# Patient Record
Sex: Female | Born: 1965 | Race: White | Hispanic: No | Marital: Married | Smoking: Current every day smoker
Health system: Southern US, Community
[De-identification: ages and names within clinical notes are randomized; demographics above are authoritative.]

## PROBLEM LIST (undated history)

## (undated) DIAGNOSIS — J449 Chronic obstructive pulmonary disease, unspecified: Secondary | ICD-10-CM

## (undated) DIAGNOSIS — G8929 Other chronic pain: Secondary | ICD-10-CM

## (undated) DIAGNOSIS — M419 Scoliosis, unspecified: Secondary | ICD-10-CM

## (undated) DIAGNOSIS — I219 Acute myocardial infarction, unspecified: Secondary | ICD-10-CM

## (undated) DIAGNOSIS — Z9989 Dependence on other enabling machines and devices: Secondary | ICD-10-CM

## (undated) DIAGNOSIS — I10 Essential (primary) hypertension: Secondary | ICD-10-CM

## (undated) DIAGNOSIS — R0600 Dyspnea, unspecified: Secondary | ICD-10-CM

## (undated) DIAGNOSIS — M549 Dorsalgia, unspecified: Secondary | ICD-10-CM

## (undated) DIAGNOSIS — M1990 Unspecified osteoarthritis, unspecified site: Secondary | ICD-10-CM

## (undated) DIAGNOSIS — G4730 Sleep apnea, unspecified: Secondary | ICD-10-CM

## (undated) DIAGNOSIS — K219 Gastro-esophageal reflux disease without esophagitis: Secondary | ICD-10-CM

## (undated) HISTORY — PX: APPENDECTOMY: SHX54

## (undated) HISTORY — PX: TONSILLECTOMY: SUR1361

---

## 2013-01-01 ENCOUNTER — Encounter (HOSPITAL_COMMUNITY): Payer: Self-pay | Admitting: Emergency Medicine

## 2013-01-01 ENCOUNTER — Emergency Department (HOSPITAL_COMMUNITY)
Admission: EM | Admit: 2013-01-01 | Discharge: 2013-01-02 | Disposition: A | Discharge status: Home / Self Care | Payer: Self-pay | Attending: Emergency Medicine | Admitting: Emergency Medicine

## 2013-01-01 DIAGNOSIS — Z09 Encounter for follow-up examination after completed treatment for conditions other than malignant neoplasm: Secondary | ICD-10-CM | POA: Insufficient documentation

## 2013-01-01 DIAGNOSIS — M545 Low back pain, unspecified: Secondary | ICD-10-CM | POA: Insufficient documentation

## 2013-01-01 DIAGNOSIS — Z9989 Dependence on other enabling machines and devices: Secondary | ICD-10-CM | POA: Insufficient documentation

## 2013-01-01 DIAGNOSIS — G8929 Other chronic pain: Secondary | ICD-10-CM | POA: Insufficient documentation

## 2013-01-01 DIAGNOSIS — G4730 Sleep apnea, unspecified: Secondary | ICD-10-CM | POA: Insufficient documentation

## 2013-01-01 DIAGNOSIS — F17200 Nicotine dependence, unspecified, uncomplicated: Secondary | ICD-10-CM | POA: Insufficient documentation

## 2013-01-01 DIAGNOSIS — M419 Scoliosis, unspecified: Secondary | ICD-10-CM | POA: Insufficient documentation

## 2013-01-01 DIAGNOSIS — Z8739 Personal history of other diseases of the musculoskeletal system and connective tissue: Secondary | ICD-10-CM | POA: Insufficient documentation

## 2013-01-01 HISTORY — DX: Scoliosis, unspecified: M41.9

## 2013-01-01 HISTORY — DX: Sleep apnea, unspecified: G47.30

## 2013-01-01 HISTORY — DX: Dorsalgia, unspecified: M54.9

## 2013-01-01 HISTORY — DX: Other chronic pain: G89.29

## 2013-01-01 HISTORY — DX: Dependence on other enabling machines and devices: Z99.89

## 2013-01-01 HISTORY — DX: Unspecified osteoarthritis, unspecified site: M19.90

## 2013-01-01 NOTE — ED Notes (Signed)
Patient reports that she has had severe lower back pain for years; has been seeing a new doctor for five months.  PCP referred patient to come to ED to get x-rays of her back.  Patient on pain medication for chronic back pain; history of scoliosis.

## 2013-01-01 NOTE — ED Provider Notes (Signed)
History   This chart was scribed for non-physician practitioner working with Derwood Kaplan, MD by Leone Payor, ED Scribe. This patient was seen in room TR05C/TR05C and the patient's care was started at 2253.   CSN: [id number]  Arrival date & time 01/01/13  2253   First MD Initiated Contact with Patient 01/01/13 2306      Chief Complaint  Patient presents with  . Back Pain     The history is provided by the patient. No language interpreter was used.    Lori Ayers is a 47 y.o. female who presents to the Emergency Department complaining of constant, unchanged, lower back pain starting several years ago. Pt reports she has been seeing a new doctor for 5 months and PCP referred pt to come to ED to get x-rays of her back. Pt denies numbness, weakness, tingling. Pt states she has a hx of scoliosis. The Rx for imaging was written on 12/09/2012.   Pt has h/o chronic back pain, scoliosis, arthritis.  Pt is a current everyday smoker but denies alcohol use.  PCP Dr. Bascom Levels.  Past Medical History  Diagnosis Date  . Chronic back pain   . Arthritis   . Sleep apnea   . CPAP (continuous positive airway pressure) dependence   . Scoliosis     Past Surgical History  Procedure Date  . Appendectomy   . Tonsillectomy     History reviewed. No pertinent family history.  History  Substance Use Topics  . Smoking status: Current Every Day Smoker -- 0.5 packs/day  . Smokeless tobacco: Not on file  . Alcohol Use: No    No OB history provided.   Review of Systems  A complete 10 system review of systems was obtained and all systems are negative except as noted in the HPI and PMH.   Allergies  Review of patient's allergies indicates no known allergies.  Home Medications  No current outpatient prescriptions on file.  BP 133/82  Pulse 65  Temp 98.3 F (36.8 C) (Oral)  Resp 18  SpO2 97%  LMP 12/29/2012  Physical Exam  Nursing note and vitals reviewed. Constitutional: She appears  well-developed and well-nourished. No distress.  HENT:  Head: Normocephalic and atraumatic.  Eyes: Conjunctivae normal and EOM are normal.  Neck: Normal range of motion. Neck supple.  Cardiovascular:       Intact distal pulses, capillary refill < 3 seconds  Musculoskeletal:       All other extremities with normal ROM  Neurological:       No sensory deficit  Skin: She is not diaphoretic.       Skin intact, no tenting    ED Course  Procedures (including critical care time)  DIAGNOSTIC STUDIES: Oxygen Saturation is 97% on room air, adequate by my interpretation.    COORDINATION OF CARE:   11:22 PM Nurse verifying with charge to find out if allowed to do imaging for non-urgent care.   Labs Reviewed - No data to display No results found.   No diagnosis found.    MDM  Pt presents to ER requesting imaging with a prescription from PCP.  As there is no emergent reason for imaging and pt is not in any new pain she has been advised to present either to urgent care. Or pt is to call her PCP and ask what imaging fasciity to get images from.       I personally performed the services described in this documentation, which was scribed in my  presence. The recorded information has been reviewed and is accurate.       Jaci Carrel, [place] 01/01/13 2339

## 2013-01-02 NOTE — ED Provider Notes (Signed)
Medical screening examination/treatment/procedure(s) were performed by non-physician practitioner and as supervising physician I was immediately available for consultation/collaboration.  Victor Langenbach, MD 01/02/13 1551 

## 2013-01-12 ENCOUNTER — Encounter (HOSPITAL_COMMUNITY): Payer: Self-pay | Admitting: Emergency Medicine

## 2013-01-12 ENCOUNTER — Emergency Department (HOSPITAL_COMMUNITY)
Admission: EM | Admit: 2013-01-12 | Discharge: 2013-01-13 | Disposition: A | Discharge status: Home / Self Care | Payer: Self-pay | Attending: Emergency Medicine | Admitting: Emergency Medicine

## 2013-01-12 DIAGNOSIS — Z79899 Other long term (current) drug therapy: Secondary | ICD-10-CM | POA: Insufficient documentation

## 2013-01-12 DIAGNOSIS — M4120 Other idiopathic scoliosis, site unspecified: Secondary | ICD-10-CM | POA: Insufficient documentation

## 2013-01-12 DIAGNOSIS — F17200 Nicotine dependence, unspecified, uncomplicated: Secondary | ICD-10-CM | POA: Insufficient documentation

## 2013-01-12 DIAGNOSIS — R112 Nausea with vomiting, unspecified: Secondary | ICD-10-CM | POA: Insufficient documentation

## 2013-01-12 DIAGNOSIS — G4733 Obstructive sleep apnea (adult) (pediatric): Secondary | ICD-10-CM | POA: Insufficient documentation

## 2013-01-12 DIAGNOSIS — M419 Scoliosis, unspecified: Secondary | ICD-10-CM

## 2013-01-12 DIAGNOSIS — G8929 Other chronic pain: Secondary | ICD-10-CM | POA: Insufficient documentation

## 2013-01-12 DIAGNOSIS — Z760 Encounter for issue of repeat prescription: Secondary | ICD-10-CM | POA: Insufficient documentation

## 2013-01-12 DIAGNOSIS — Z8739 Personal history of other diseases of the musculoskeletal system and connective tissue: Secondary | ICD-10-CM | POA: Insufficient documentation

## 2013-01-12 DIAGNOSIS — M79609 Pain in unspecified limb: Secondary | ICD-10-CM | POA: Insufficient documentation

## 2013-01-12 DIAGNOSIS — M549 Dorsalgia, unspecified: Secondary | ICD-10-CM

## 2013-01-12 MED ORDER — OXYCODONE-ACETAMINOPHEN 5-325 MG PO TABS: 1.0000 | ORAL_TABLET | Freq: Three times a day (TID) | ORAL | Status: DC

## 2013-01-12 NOTE — ED Provider Notes (Signed)
History   This chart was scribed for non-physician practitioner working with Lori Ayers, by Lori Ayers, ED Scribe. This patient was seen in room TR09C/TR09C and the patient's care was started at 11:33 PM.    CSN: [id number]  Arrival date & time 01/12/13  1852   First MD Initiated Contact with Patient 01/12/13 2252      Chief Complaint  Patient presents with  . Withdrawal     The history is provided by the patient and medical records. No language interpreter was used.  Lori Ayers is a 47 y.o. female with h/o chronic back pain who presents to the Emergency Department complaining of withdrawal from 3-4 years of 15mg  oxycodone 4 times daily use for chronic back pain from prior injury and h/o scoliosis.  Pt reports current nausea, leg cramps, with aching, sharp, 10/10 lower back pain.  Pt reports that his PCP had been providing this supply of oxycodone but had to suddenly stop because he was contacted by the [employer].  Pt reports PCP told her to come here to get oxycodone.  Pt has appointment 02/17 for new pain management doctor.  No recent injuries.  Pain is not currently worsened for any reason other than that she does not have pain medication.  No incontinence, no numbness.  No allergies.  No IV drug use.  Pt is a current everyday smoker but denies alcohol use.  PCP is Dr. Audrie Ayers.   Past Medical History  Diagnosis Date  . Chronic back pain   . Arthritis   . Sleep apnea   . CPAP (continuous positive airway pressure) dependence   . Scoliosis     Past Surgical History  Procedure Date  . Appendectomy   . Tonsillectomy     No family history on file.  History  Substance Use Topics  . Smoking status: Current Every Day Smoker -- 0.5 packs/day  . Smokeless tobacco: Not on file  . Alcohol Use: No    No OB history provided.   Review of Systems  Constitutional: Negative for fever and fatigue.  HENT: Negative for neck pain and neck stiffness.   Respiratory: Negative for chest  tightness and shortness of breath.   Cardiovascular: Negative for chest pain.  Gastrointestinal: Negative for nausea, vomiting, abdominal pain and diarrhea.  Genitourinary: Negative for dysuria, urgency, frequency and hematuria.  Musculoskeletal: Positive for back pain (chronic). Negative for joint swelling and gait problem.  Skin: Negative for rash.  Neurological: Negative for weakness, light-headedness, numbness and headaches.  All other systems reviewed and are negative.    Allergies  Review of patient's allergies indicates no known allergies.  Home Medications   Current Outpatient Rx  Name  Route  Sig  Dispense  Refill  . OMEPRAZOLE 20 MG PO CPDR   Oral   Take 20 mg by mouth daily.         . OXYCODONE HCL 15 MG PO TABS   Oral   Take 15 mg by mouth every 6 (six) hours as needed. For pain         . SULINDAC 200 MG PO TABS   Oral   Take 200 mg by mouth 2 (two) times daily.         . OXYCODONE HCL 10 MG PO TABS   Oral   Take 1 tablet (10 mg total) by mouth 3 (three) times daily.   10 tablet   0     BP 127/79  Pulse 84  Temp 98.3 F (  36.8 C) (Oral)  Resp 18  SpO2 99%  LMP 12/29/2012  Physical Exam  Nursing note and vitals reviewed. Constitutional: She is oriented to person, place, and time. She appears well-developed and well-nourished. No distress.  HENT:  Head: Normocephalic and atraumatic.  Mouth/Throat: Oropharynx is clear and moist. No oropharyngeal exudate.  Eyes: Conjunctivae normal are normal. Pupils are equal, round, and reactive to light.  Neck: Normal range of motion. Neck supple.       Full ROM without pain  Cardiovascular: Normal rate, regular rhythm, normal heart sounds and intact distal pulses.  Exam reveals no gallop and no friction rub.   No murmur heard. Pulmonary/Chest: Effort normal and breath sounds normal. No respiratory distress. She has no wheezes. She has no rales. She exhibits no tenderness.  Abdominal: Soft. Bowel sounds are  normal. She exhibits no distension and no mass. There is no tenderness. There is no rebound and no guarding.  Musculoskeletal: Normal range of motion. She exhibits no edema and no tenderness.  Lymphadenopathy:    She has no cervical adenopathy.  Neurological: She is alert and oriented to person, place, and time. She has normal reflexes. She exhibits normal muscle tone. Coordination normal.       Speech is clear and goal oriented, follows commands Normal strength in upper and lower extremities bilaterally including dorsiflexion and plantar flexion, strong and equal grip strength Sensation normal to light and sharp touch Moves extremities without ataxia, coordination intact Normal gait Normal balance   Skin: Skin is warm and dry. No rash noted. She is not diaphoretic. There is erythema (R hand).       Pt with tract marks along the R hand and wrist    ED Course  Procedures (including critical care time) DIAGNOSTIC STUDIES: Oxygen Saturation is 99% on room air, normal by my interpretation.    COORDINATION OF CARE: 11:40 PM- Informed pt that I can only write 3-day prescription for oxycodone and that she needs to get in contact with PCP or pain management.  Labs Reviewed - No data to display No results found.   1. Medication refill   2. Chronic back pain   3. Scoliosis       MDM  Lori Ayers since the emergency department complaining of being out of her oxycodone 15 mg which she takes 4 times a day for pain management. Patient states Lori Ayers prescribed these for her and is unable to prescribe them now.  Patient denies IV drug use but obvious track marks are present. Discussed with patient at length that I will write his prescription one time only. I made it clear to her that if she presents back to the emergency department her pain medications will not be refilled here. She is to find a pain management clinic or another primary care provider who will handle her pain management.  1.  Medications: oxycodne, usual home medications  2. Treatment: rest, drink plenty of fluids,  3. Follow Up: Please followup with your primary doctor for discussion of your diagnoses and further evaluation after today's visit; if you do not have a primary care doctor use the resource guide provided to find one;   I personally performed the services described in this documentation, which was scribed in my presence. The recorded information has been reviewed and is accurate.   Dahlia Client Jarome Trull, PA-C 01/13/13 [phone]

## 2013-01-12 NOTE — ED Notes (Signed)
Pt c/o N/V/D, pt reports vomiting x 2 today, pt reports x 3 diarrhea episodes, pt takes 15 mg IR Oxycodone q6h PRN x 4 yrs, pt states, "I am withdrawing."

## 2013-01-12 NOTE — ED Notes (Signed)
Taking oxycodone for 3-4 years and her Doctor unable to prescribe medication. States took last tablet in am today.  States takes medication 4 times a day and now currently nausea pain lower back 10/10 achy sharp and hurt all over.  Has a new Doctor's appointment on Feb 17th.  Here medication refill.

## 2013-01-13 MED ORDER — OXYCODONE HCL 10 MG PO TABS: 10.0000 mg | ORAL_TABLET | Freq: Three times a day (TID) | ORAL | Status: DC

## 2013-01-13 NOTE — ED Provider Notes (Signed)
Medical screening examination/treatment/procedure(s) were performed by non-physician practitioner and as supervising physician I was immediately available for consultation/collaboration.   Assia Meanor J. Kamauri Denardo, MD 01/13/13 1110 

## 2013-01-13 NOTE — ED Notes (Signed)
Pt refused to take the prescriptions given by the MD. Pt was upset because its not the right dosage. Pt talked with MD. Pt stormed out and ripped up the prescription. Husband by side of patient.

## 2014-12-08 HISTORY — PX: TOTAL HIP ARTHROPLASTY: SHX124

## 2018-09-01 DIAGNOSIS — I361 Nonrheumatic tricuspid (valve) insufficiency: Secondary | ICD-10-CM

## 2020-03-27 ENCOUNTER — Other Ambulatory Visit: Payer: Self-pay | Admitting: Obstetrics & Gynecology

## 2020-03-27 DIAGNOSIS — N6489 Other specified disorders of breast: Secondary | ICD-10-CM

## 2020-04-10 ENCOUNTER — Ambulatory Visit
Admission: RE | Admit: 2020-04-10 | Discharge: 2020-04-10 | Disposition: A | Discharge status: Home / Self Care | Payer: BC Managed Care – PPO | Source: Ambulatory Visit | Attending: Obstetrics & Gynecology | Admitting: Obstetrics & Gynecology

## 2020-04-10 ENCOUNTER — Other Ambulatory Visit: Payer: Self-pay

## 2020-04-10 DIAGNOSIS — N6489 Other specified disorders of breast: Secondary | ICD-10-CM

## 2021-10-14 ENCOUNTER — Emergency Department (HOSPITAL_COMMUNITY)
Admission: EM | Admit: 2021-10-14 | Discharge: 2021-10-14 | Disposition: A | Discharge status: Home / Self Care | Payer: BC Managed Care – PPO | Attending: Emergency Medicine | Admitting: Emergency Medicine

## 2021-10-14 ENCOUNTER — Emergency Department (HOSPITAL_COMMUNITY): Payer: BC Managed Care – PPO

## 2021-10-14 DIAGNOSIS — F17210 Nicotine dependence, cigarettes, uncomplicated: Secondary | ICD-10-CM | POA: Insufficient documentation

## 2021-10-14 DIAGNOSIS — W010XXA Fall on same level from slipping, tripping and stumbling without subsequent striking against object, initial encounter: Secondary | ICD-10-CM | POA: Insufficient documentation

## 2021-10-14 DIAGNOSIS — M25562 Pain in left knee: Secondary | ICD-10-CM | POA: Insufficient documentation

## 2021-10-14 DIAGNOSIS — S82045A Nondisplaced comminuted fracture of left patella, initial encounter for closed fracture: Secondary | ICD-10-CM | POA: Insufficient documentation

## 2021-10-14 DIAGNOSIS — S82002A Unspecified fracture of left patella, initial encounter for closed fracture: Secondary | ICD-10-CM

## 2021-10-14 DIAGNOSIS — S8992XA Unspecified injury of left lower leg, initial encounter: Secondary | ICD-10-CM | POA: Diagnosis present

## 2021-10-14 MED ORDER — OXYCODONE HCL 5 MG PO TABS
20.0000 mg | ORAL_TABLET | Freq: Once | ORAL | Status: AC
  Administered 2021-10-14: 20 mg via ORAL
  Filled 2021-10-14: qty 4

## 2021-10-14 NOTE — ED Notes (Signed)
Pt placed on bedpan to void.  Socks applied

## 2021-10-14 NOTE — ED Provider Notes (Signed)
[facility]-EMERGENCY DEPT Provider Note   CSN: [id number] Arrival date & time: 10/14/21  1352     History Chief Complaint  Patient presents with   Fall    Lori Ayers is a 55 y.o. female.  55 year old female with medical history as detailed below presents for evaluation.  Patient reports a fall that occurred yesterday evening.  She injured the left knee.  She complains of significant pain to the left knee.  She is unable to extend the knee.  She denies other injury.  Reports that she takes 20 mg of oxycodone 5 times a day for chronic pain.  This has been adequate for pain control of her left knee injury.  The history is provided by the patient.  Fall This is a new problem. The current episode started yesterday. The problem occurs rarely. The problem has not changed since onset.Pertinent negatives include no chest pain and no abdominal pain. Nothing aggravates the symptoms. Nothing relieves the symptoms.      Past Medical History:  Diagnosis Date   Arthritis    Chronic back pain    CPAP (continuous positive airway pressure) dependence    Scoliosis    Sleep apnea     Patient Active Problem List   Diagnosis Date Noted   Sleep apnea    CPAP (continuous positive airway pressure) dependence    Scoliosis     Past Surgical History:  Procedure Laterality Date   APPENDECTOMY     TONSILLECTOMY       OB History   No obstetric history on file.     No family history on file.  Social History   Tobacco Use   Smoking status: Every Day    Packs/day: 0.50    Types: Cigarettes  Substance Use Topics   Alcohol use: No   Drug use: No    Home Medications Prior to Admission medications   Medication Sig Start Date End Date Taking? Authorizing Provider  omeprazole (PRILOSEC) 20 MG capsule Take 20 mg by mouth daily.    [provider]  oxyCODONE (ROXICODONE) 15 MG immediate release tablet Take 15 mg by mouth every 6 (six) hours as needed. For  pain    [provider]  Oxycodone HCl 10 MG TABS Take 1 tablet (10 mg total) by mouth 3 (three) times daily. 01/13/13   Muthersbaugh, Dahlia Client, PA-C  sulindac (CLINORIL) 200 MG tablet Take 200 mg by mouth 2 (two) times daily.    [provider]    Allergies    Patient has no known allergies.  Review of Systems   Review of Systems  Cardiovascular:  Negative for chest pain.  Gastrointestinal:  Negative for abdominal pain.  All other systems reviewed and are negative.  Physical Exam Updated Vital Signs BP (!) 159/113 (BP Location: Left Arm)   Pulse (!) 106   Temp 98.2 F (36.8 C) (Oral)   Resp 16   Ht 5\' 2"  (1.575 m)   Wt 61.2 kg   SpO2 95%   BMI 24.69 kg/m   Physical Exam Vitals and nursing note reviewed.  Constitutional:      General: She is not in acute distress.    Appearance: Normal appearance. She is well-developed.  HENT:     Head: Normocephalic and atraumatic.  Eyes:     Conjunctiva/sclera: Conjunctivae normal.     Pupils: Pupils are equal, round, and reactive to light.  Cardiovascular:     Rate and Rhythm: Normal rate and regular rhythm.  Heart sounds: Normal heart sounds.  Pulmonary:     Effort: Pulmonary effort is normal. No respiratory distress.     Breath sounds: Normal breath sounds.  Abdominal:     General: There is no distension.     Palpations: Abdomen is soft.     Tenderness: There is no abdominal tenderness.  Musculoskeletal:        General: Tenderness present. No deformity.     Cervical back: Normal range of motion and neck supple.     Comments: Tenderness and edema noted over the anterior left knee.  Patient is unable to extend her knee against gravity.  Distal left lower extremity is neurovascularly intact.  Skin:    General: Skin is warm and dry.  Neurological:     General: No focal deficit present.     Mental Status: She is alert and oriented to person, place, and time.    ED Results / Procedures / Treatments    Labs (all labs ordered are listed, but only abnormal results are displayed) Labs Reviewed - No data to display  EKG None  Radiology DG Knee Complete 4 Views Left  Result Date: 10/14/2021 CLINICAL DATA:  Knee pain with limited weight-bearing after slipping and falling last night. EXAM: LEFT KNEE - COMPLETE 4+ VIEW COMPARISON:  Limited correlation made with CT of the left thigh 10/28/2013. FINDINGS: There is an acute, mildly comminuted nearly transverse fracture through the patella. This is associated with up to 9 mm of distraction of the fracture fragments. The patella is located. There is posttraumatic deformity of the proximal fibular diaphysis consistent with an old healed fracture. No other acute fracture or dislocation identified. There is a large lipohemarthrosis. IMPRESSION: Comminuted and mildly displaced fracture of the patella with associated lipohemarthrosis. Electronically Signed   By: Carey Bullocks M.D.   On: 10/14/2021 14:50    Procedures Procedures   Medications Ordered in ED Medications  oxyCODONE (Oxy IR/ROXICODONE) immediate release tablet 20 mg (has no administration in time range)    ED Course  I have reviewed the triage vital signs and the nursing notes.  Pertinent labs & imaging results that were available during my care of the patient were reviewed by me and considered in my medical decision making (see chart for details).    MDM Rules/Calculators/A&P                           MDM  MSE complete  Lori Ayers was evaluated in Emergency Department on 10/14/2021 for the symptoms described in the history of present illness. She was evaluated in the context of the global COVID-19 pandemic, which necessitated consideration that the patient might be at risk for infection with the SARS-CoV-2 virus that causes COVID-19. Institutional protocols and algorithms that pertain to the evaluation of patients at risk for COVID-19 are in a state of rapid change based on  information released by regulatory bodies including the CDC and federal and state organizations. These policies and algorithms were followed during the patient's care in the ED.  Patient is presenting with injury to the left knee for  Imaging reveals patellar fracture.  Patient's case discussed with Dr. Victorino Dike of [facility].  Patient is appropriate for outpatient management.  Patient understands need for close follow-up.  Strict return precautions given and understood.    Final Clinical Impression(s) / ED Diagnoses Final diagnoses:  Closed nondisplaced fracture of left patella, unspecified fracture morphology, initial encounter    Rx /  DC Orders ED Discharge Orders     None        Wynetta Fines, MD 10/14/21 [phone]

## 2021-10-14 NOTE — ED Triage Notes (Signed)
Ems brings pt in from home for a fall. States pt slipped and fell last night on to her left knee. Pt unable to put weight on her foot.

## 2021-10-14 NOTE — Discharge Instructions (Signed)
Return for any problem.  ?

## 2021-10-14 NOTE — ED Notes (Signed)
Applied ace wrap and gave pt crutches

## 2021-10-15 ENCOUNTER — Encounter (HOSPITAL_COMMUNITY): Payer: Self-pay | Admitting: Orthopedic Surgery

## 2021-10-15 ENCOUNTER — Other Ambulatory Visit: Payer: Self-pay

## 2021-10-15 NOTE — Progress Notes (Addendum)
Lori Lori Ayers asked me to speak to her husband, Lori Ayers.  Lori Ayers reports that patient does not have chest pain, she had a MI in 2016,Lori Ayers reports that Lori Ayers was told to only follow up with "heart Dr." If she needed to . Lori Ayers has COPD and is short of breath most of the time, she is on oxygen 2- 3.5 liters, patient continues to smoke 1 pack of cigarettes a day.  Lori Ayers's Pulmonologist is Dr. Blenda Nicely in [place], [place] requesting records.  Lori Ayers is unable to put weight on her leg, patient transfers to a wheelchair and then to the truck.  I instructed Lori Ayers to give patient a good wash up,antibiotic soap, if it is available.  Dry off with a clean towel. Do not put lotion, powder, cologne or deodorant or makeup.No jewelry or piercings. Men may shave their face and neck. Woman should not shave. No nail polish, artificial or acrylic nails. Wear clean clothes, brush your teeth. Glasses, contact lens,dentures or partials may not be worn in the OR. If you need to wear them, please bring a case for glasses, do not wear contacts or bring a case, the hospital does not have contact cases, dentures or partials will have to be removed , make sure they are clean, we will provide a denture cup to put them in. You will need some one to drive you home and a responsible person over the age of 71 to stay with you for the first 24 hours after surgery.

## 2021-10-16 ENCOUNTER — Ambulatory Visit (HOSPITAL_COMMUNITY): Payer: Medicaid Other | Admitting: Anesthesiology

## 2021-10-16 ENCOUNTER — Observation Stay (HOSPITAL_COMMUNITY)
Admission: RE | Admit: 2021-10-16 | Discharge: 2021-10-18 | DRG: 517 | Disposition: A | Discharge status: Home Health | Payer: Medicaid Other | Source: Ambulatory Visit | Attending: Orthopedic Surgery | Admitting: Orthopedic Surgery

## 2021-10-16 ENCOUNTER — Encounter (HOSPITAL_COMMUNITY): Payer: Self-pay | Admitting: Orthopedic Surgery

## 2021-10-16 ENCOUNTER — Other Ambulatory Visit: Payer: Self-pay

## 2021-10-16 ENCOUNTER — Encounter (HOSPITAL_COMMUNITY)
Admission: RE | Disposition: A | Discharge status: Home Health | Payer: Self-pay | Source: Ambulatory Visit | Attending: Orthopedic Surgery

## 2021-10-16 ENCOUNTER — Ambulatory Visit (HOSPITAL_COMMUNITY): Payer: Medicaid Other

## 2021-10-16 DIAGNOSIS — Z7951 Long term (current) use of inhaled steroids: Secondary | ICD-10-CM

## 2021-10-16 DIAGNOSIS — S82032A Displaced transverse fracture of left patella, initial encounter for closed fracture: Principal | ICD-10-CM | POA: Diagnosis present

## 2021-10-16 DIAGNOSIS — I252 Old myocardial infarction: Secondary | ICD-10-CM | POA: Diagnosis not present

## 2021-10-16 DIAGNOSIS — I10 Essential (primary) hypertension: Secondary | ICD-10-CM | POA: Diagnosis not present

## 2021-10-16 DIAGNOSIS — W010XXA Fall on same level from slipping, tripping and stumbling without subsequent striking against object, initial encounter: Secondary | ICD-10-CM | POA: Diagnosis present

## 2021-10-16 DIAGNOSIS — J449 Chronic obstructive pulmonary disease, unspecified: Secondary | ICD-10-CM | POA: Diagnosis not present

## 2021-10-16 DIAGNOSIS — Z888 Allergy status to other drugs, medicaments and biological substances status: Secondary | ICD-10-CM | POA: Diagnosis not present

## 2021-10-16 DIAGNOSIS — F17210 Nicotine dependence, cigarettes, uncomplicated: Secondary | ICD-10-CM | POA: Diagnosis present

## 2021-10-16 DIAGNOSIS — K219 Gastro-esophageal reflux disease without esophagitis: Secondary | ICD-10-CM | POA: Diagnosis present

## 2021-10-16 DIAGNOSIS — Z96641 Presence of right artificial hip joint: Secondary | ICD-10-CM | POA: Diagnosis present

## 2021-10-16 DIAGNOSIS — I2510 Atherosclerotic heart disease of native coronary artery without angina pectoris: Secondary | ICD-10-CM | POA: Diagnosis present

## 2021-10-16 DIAGNOSIS — Z419 Encounter for procedure for purposes other than remedying health state, unspecified: Secondary | ICD-10-CM

## 2021-10-16 DIAGNOSIS — G894 Chronic pain syndrome: Secondary | ICD-10-CM | POA: Diagnosis present

## 2021-10-16 DIAGNOSIS — Z79891 Long term (current) use of opiate analgesic: Secondary | ICD-10-CM

## 2021-10-16 DIAGNOSIS — M1990 Unspecified osteoarthritis, unspecified site: Secondary | ICD-10-CM | POA: Diagnosis not present

## 2021-10-16 DIAGNOSIS — F40240 Claustrophobia: Secondary | ICD-10-CM | POA: Diagnosis present

## 2021-10-16 DIAGNOSIS — R072 Precordial pain: Secondary | ICD-10-CM | POA: Diagnosis not present

## 2021-10-16 DIAGNOSIS — M419 Scoliosis, unspecified: Secondary | ICD-10-CM | POA: Diagnosis not present

## 2021-10-16 DIAGNOSIS — G4733 Obstructive sleep apnea (adult) (pediatric): Secondary | ICD-10-CM | POA: Diagnosis not present

## 2021-10-16 DIAGNOSIS — R9431 Abnormal electrocardiogram [ECG] [EKG]: Secondary | ICD-10-CM | POA: Diagnosis not present

## 2021-10-16 DIAGNOSIS — Z716 Tobacco abuse counseling: Secondary | ICD-10-CM | POA: Diagnosis not present

## 2021-10-16 DIAGNOSIS — Z8249 Family history of ischemic heart disease and other diseases of the circulatory system: Secondary | ICD-10-CM | POA: Diagnosis not present

## 2021-10-16 DIAGNOSIS — Z79899 Other long term (current) drug therapy: Secondary | ICD-10-CM | POA: Diagnosis not present

## 2021-10-16 DIAGNOSIS — S82002A Unspecified fracture of left patella, initial encounter for closed fracture: Secondary | ICD-10-CM | POA: Diagnosis present

## 2021-10-16 HISTORY — DX: Essential (primary) hypertension: I10

## 2021-10-16 HISTORY — DX: Acute myocardial infarction, unspecified: I21.9

## 2021-10-16 HISTORY — DX: Gastro-esophageal reflux disease without esophagitis: K21.9

## 2021-10-16 HISTORY — PX: ORIF PATELLA: SHX5033

## 2021-10-16 HISTORY — DX: Dyspnea, unspecified: R06.00

## 2021-10-16 HISTORY — DX: Chronic obstructive pulmonary disease, unspecified: J44.9

## 2021-10-16 LAB — TROPONIN I (HIGH SENSITIVITY)
Troponin I (High Sensitivity): 3 ng/L (ref <18)
Troponin I (High Sensitivity): 3 ng/L (ref <18)

## 2021-10-16 LAB — BASIC METABOLIC PANEL
Anion gap: 11 (ref 5–15)
BUN: 5 mg/dL — ABNORMAL LOW (ref 6–20)
CO2: 27 mmol/L (ref 22–32)
Calcium: 9 mg/dL (ref 8.9–10.3)
Chloride: 97 mmol/L — ABNORMAL LOW (ref 98–111)
Creatinine, Ser: 0.6 mg/dL (ref 0.44–1.00)
GFR, Estimated: >60 mL/min (ref >60)
Glucose, Bld: 114 mg/dL — ABNORMAL HIGH (ref 70–99)
Potassium: 3.7 mmol/L (ref 3.5–5.1)
Sodium: 135 mmol/L (ref 135–145)

## 2021-10-16 LAB — CBC
HCT: 43.7 % (ref 36.0–46.0)
HCT: 37.1 % (ref 36.0–46.0)
Hemoglobin: 15.3 g/dL — ABNORMAL HIGH (ref 12.0–15.0)
Hemoglobin: 13.1 g/dL (ref 12.0–15.0)
MCH: 39.3 pg — ABNORMAL HIGH (ref 26.0–34.0)
MCH: 39.6 pg — ABNORMAL HIGH (ref 26.0–34.0)
MCHC: 35 g/dL (ref 30.0–36.0)
MCHC: 35.3 g/dL (ref 30.0–36.0)
MCV: 112.3 fL — ABNORMAL HIGH (ref 80.0–100.0)
MCV: 112.1 fL — ABNORMAL HIGH (ref 80.0–100.0)
Platelets: UNDETERMINED 10*3/uL (ref 150–400)
Platelets: 89 10*3/uL — ABNORMAL LOW (ref 150–400)
RBC: 3.89 MIL/uL (ref 3.87–5.11)
RBC: 3.31 MIL/uL — ABNORMAL LOW (ref 3.87–5.11)
RDW: 14.5 % (ref 11.5–15.5)
RDW: 14.5 % (ref 11.5–15.5)
WBC: 6.8 10*3/uL (ref 4.0–10.5)
WBC: 5.3 10*3/uL (ref 4.0–10.5)
nRBC: 0 % (ref 0.0–0.2)
nRBC: 0 % (ref 0.0–0.2)

## 2021-10-16 LAB — CREATININE, SERUM
Creatinine, Ser: 0.69 mg/dL (ref 0.44–1.00)
GFR, Estimated: >60 mL/min (ref >60)

## 2021-10-16 SURGERY — OPEN REDUCTION INTERNAL FIXATION (ORIF) PATELLA
Anesthesia: Spinal | Site: Knee | Laterality: Left

## 2021-10-16 MED ORDER — PROPOFOL 10 MG/ML IV BOLUS
INTRAVENOUS | Status: DC | PRN
  Administered 2021-10-16 (×2): 20 mg via INTRAVENOUS
  Administered 2021-10-16: 30 mg via INTRAVENOUS

## 2021-10-16 MED ORDER — DOCUSATE SODIUM 100 MG PO CAPS
100.0000 mg | ORAL_CAPSULE | Freq: Two times a day (BID) | ORAL | Status: DC
  Administered 2021-10-16 – 2021-10-18 (×4): 100 mg via ORAL
  Filled 2021-10-16 (×4): qty 1

## 2021-10-16 MED ORDER — HYDROMORPHONE HCL 1 MG/ML IJ SOLN
2.0000 mg | Freq: Once | INTRAMUSCULAR | Status: AC
  Administered 2021-10-17: 2 mg via INTRAVENOUS
  Filled 2021-10-16: qty 2

## 2021-10-16 MED ORDER — TRAMADOL HCL 50 MG PO TABS
50.0000 mg | ORAL_TABLET | Freq: Four times a day (QID) | ORAL | Status: DC
  Administered 2021-10-16 – 2021-10-18 (×8): 50 mg via ORAL
  Filled 2021-10-16 (×8): qty 1

## 2021-10-16 MED ORDER — BUPIVACAINE IN DEXTROSE 0.75-8.25 % IT SOLN
INTRATHECAL | Status: DC | PRN
  Administered 2021-10-16: 1.6 mL via INTRATHECAL

## 2021-10-16 MED ORDER — ACETAMINOPHEN 500 MG PO TABS
1000.0000 mg | ORAL_TABLET | Freq: Once | ORAL | Status: AC
  Administered 2021-10-16: 1000 mg via ORAL
  Filled 2021-10-16: qty 2

## 2021-10-16 MED ORDER — ONDANSETRON HCL 4 MG/2ML IJ SOLN
INTRAMUSCULAR | Status: AC
  Filled 2021-10-16: qty 2

## 2021-10-16 MED ORDER — ORAL CARE MOUTH RINSE: 15.0000 mL | Freq: Once | OROMUCOSAL | Status: AC

## 2021-10-16 MED ORDER — METHOCARBAMOL 1000 MG/10ML IJ SOLN
500.0000 mg | Freq: Four times a day (QID) | INTRAVENOUS | Status: DC | PRN
  Filled 2021-10-16: qty 5

## 2021-10-16 MED ORDER — 0.9 % SODIUM CHLORIDE (POUR BTL) OPTIME
TOPICAL | Status: DC | PRN
  Administered 2021-10-16: 1000 mL

## 2021-10-16 MED ORDER — AMITRIPTYLINE HCL 50 MG PO TABS
50.0000 mg | ORAL_TABLET | Freq: Every day | ORAL | Status: DC
  Administered 2021-10-16 – 2021-10-17 (×2): 50 mg via ORAL
  Filled 2021-10-16 (×2): qty 1

## 2021-10-16 MED ORDER — METOCLOPRAMIDE HCL 5 MG/ML IJ SOLN: 5.0000 mg | Freq: Three times a day (TID) | INTRAMUSCULAR | Status: DC | PRN

## 2021-10-16 MED ORDER — CEFAZOLIN SODIUM-DEXTROSE 2-4 GM/100ML-% IV SOLN
2.0000 g | INTRAVENOUS | Status: AC
  Administered 2021-10-16: 2 g via INTRAVENOUS
  Filled 2021-10-16: qty 100

## 2021-10-16 MED ORDER — HYDROMORPHONE HCL 1 MG/ML IJ SOLN
0.5000 mg | INTRAMUSCULAR | Status: DC | PRN
  Administered 2021-10-16: 0.5 mg via INTRAVENOUS
  Administered 2021-10-17 (×4): 1 mg via INTRAVENOUS
  Filled 2021-10-16 (×5): qty 1

## 2021-10-16 MED ORDER — SODIUM CHLORIDE 0.9 % IV SOLN: 0.0000 ug/min | INTRAVENOUS | Status: DC

## 2021-10-16 MED ORDER — OXYCODONE HCL 5 MG PO TABS
5.0000 mg | ORAL_TABLET | ORAL | Status: DC | PRN
  Administered 2021-10-18: 5 mg via ORAL
  Filled 2021-10-16: qty 1

## 2021-10-16 MED ORDER — CEFAZOLIN SODIUM-DEXTROSE 1-4 GM/50ML-% IV SOLN
1.0000 g | Freq: Four times a day (QID) | INTRAVENOUS | Status: AC
  Administered 2021-10-16 – 2021-10-17 (×2): 1 g via INTRAVENOUS
  Filled 2021-10-16 (×3): qty 50

## 2021-10-16 MED ORDER — ACETAMINOPHEN 325 MG PO TABS: 325.0000 mg | ORAL_TABLET | Freq: Four times a day (QID) | ORAL | Status: DC | PRN

## 2021-10-16 MED ORDER — FENTANYL CITRATE (PF) 250 MCG/5ML IJ SOLN
INTRAMUSCULAR | Status: AC
  Filled 2021-10-16: qty 5

## 2021-10-16 MED ORDER — MIDAZOLAM HCL 2 MG/2ML IJ SOLN
INTRAMUSCULAR | Status: DC | PRN
  Administered 2021-10-16: 2 mg via INTRAVENOUS

## 2021-10-16 MED ORDER — METHOCARBAMOL 500 MG PO TABS: 500.0000 mg | ORAL_TABLET | Freq: Four times a day (QID) | ORAL | 1 refills | Status: AC | PRN

## 2021-10-16 MED ORDER — PROPOFOL 10 MG/ML IV BOLUS
INTRAVENOUS | Status: AC
  Filled 2021-10-16: qty 20

## 2021-10-16 MED ORDER — PHENYLEPHRINE HCL-NACL 20-0.9 MG/250ML-% IV SOLN
INTRAVENOUS | Status: DC | PRN
Start: 2021-10-16 — End: 2021-10-16
  Administered 2021-10-16: 50 ug/min via INTRAVENOUS

## 2021-10-16 MED ORDER — KETOROLAC TROMETHAMINE 30 MG/ML IJ SOLN
30.0000 mg | Freq: Four times a day (QID) | INTRAMUSCULAR | Status: AC
  Administered 2021-10-16 – 2021-10-17 (×2): 30 mg via INTRAVENOUS
  Filled 2021-10-16 (×2): qty 1

## 2021-10-16 MED ORDER — OXYCODONE HCL 5 MG PO TABS
10.0000 mg | ORAL_TABLET | ORAL | Status: DC | PRN
  Administered 2021-10-16 – 2021-10-17 (×4): 15 mg via ORAL
  Filled 2021-10-16 (×4): qty 3

## 2021-10-16 MED ORDER — HYDROMORPHONE HCL 1 MG/ML IJ SOLN: 0.2500 mg | INTRAMUSCULAR | Status: DC | PRN

## 2021-10-16 MED ORDER — CHLORHEXIDINE GLUCONATE 0.12 % MT SOLN
15.0000 mL | Freq: Once | OROMUCOSAL | Status: AC
  Administered 2021-10-16: 15 mL via OROMUCOSAL
  Filled 2021-10-16: qty 15

## 2021-10-16 MED ORDER — METHOCARBAMOL 500 MG PO TABS
500.0000 mg | ORAL_TABLET | Freq: Four times a day (QID) | ORAL | Status: DC | PRN
  Administered 2021-10-16 – 2021-10-18 (×4): 500 mg via ORAL
  Filled 2021-10-16 (×4): qty 1

## 2021-10-16 MED ORDER — FLUTICASONE FUROATE-VILANTEROL 100-25 MCG/ACT IN AEPB
1.0000 | INHALATION_SPRAY | Freq: Every day | RESPIRATORY_TRACT | Status: DC | PRN
  Filled 2021-10-16: qty 28

## 2021-10-16 MED ORDER — DEXMEDETOMIDINE (PRECEDEX) IN NS 20 MCG/5ML (4 MCG/ML) IV SYRINGE
PREFILLED_SYRINGE | INTRAVENOUS | Status: AC
  Filled 2021-10-16: qty 5

## 2021-10-16 MED ORDER — HYDROMORPHONE HCL 2 MG PO TABS: 2.0000 mg | ORAL_TABLET | ORAL | 0 refills | Status: AC | PRN

## 2021-10-16 MED ORDER — DEXMEDETOMIDINE (PRECEDEX) IN NS 20 MCG/5ML (4 MCG/ML) IV SYRINGE
PREFILLED_SYRINGE | INTRAVENOUS | Status: DC | PRN
  Administered 2021-10-16: 8 ug via INTRAVENOUS

## 2021-10-16 MED ORDER — METOCLOPRAMIDE HCL 5 MG PO TABS: 5.0000 mg | ORAL_TABLET | Freq: Three times a day (TID) | ORAL | Status: DC | PRN

## 2021-10-16 MED ORDER — PROPOFOL 500 MG/50ML IV EMUL
INTRAVENOUS | Status: DC | PRN
  Administered 2021-10-16: 125 ug/kg/min via INTRAVENOUS

## 2021-10-16 MED ORDER — BUSPIRONE HCL 5 MG PO TABS
15.0000 mg | ORAL_TABLET | Freq: Three times a day (TID) | ORAL | Status: DC
  Administered 2021-10-16 – 2021-10-18 (×5): 15 mg via ORAL
  Filled 2021-10-16 (×5): qty 1

## 2021-10-16 MED ORDER — ALBUTEROL SULFATE HFA 108 (90 BASE) MCG/ACT IN AERS: 2.0000 | INHALATION_SPRAY | Freq: Four times a day (QID) | RESPIRATORY_TRACT | Status: DC | PRN

## 2021-10-16 MED ORDER — MIDAZOLAM HCL 2 MG/2ML IJ SOLN
INTRAMUSCULAR | Status: AC
  Filled 2021-10-16: qty 2

## 2021-10-16 MED ORDER — ENOXAPARIN SODIUM 40 MG/0.4ML IJ SOSY
40.0000 mg | PREFILLED_SYRINGE | INTRAMUSCULAR | Status: DC
  Administered 2021-10-17: 40 mg via SUBCUTANEOUS
  Filled 2021-10-16 (×2): qty 0.4

## 2021-10-16 MED ORDER — LACTATED RINGERS IV SOLN: INTRAVENOUS | Status: DC

## 2021-10-16 MED ORDER — BUPIVACAINE HCL (PF) 0.25 % IJ SOLN
INTRAMUSCULAR | Status: AC
  Filled 2021-10-16: qty 30

## 2021-10-16 MED ORDER — ONDANSETRON HCL 4 MG PO TABS: 4.0000 mg | ORAL_TABLET | Freq: Four times a day (QID) | ORAL | Status: DC | PRN

## 2021-10-16 MED ORDER — ACETAMINOPHEN 500 MG PO TABS
1000.0000 mg | ORAL_TABLET | Freq: Four times a day (QID) | ORAL | Status: AC
  Administered 2021-10-16 – 2021-10-17 (×4): 1000 mg via ORAL
  Filled 2021-10-16 (×4): qty 2

## 2021-10-16 MED ORDER — ONDANSETRON HCL 4 MG/2ML IJ SOLN: 4.0000 mg | Freq: Four times a day (QID) | INTRAMUSCULAR | Status: DC | PRN

## 2021-10-16 MED ORDER — ALBUTEROL SULFATE (2.5 MG/3ML) 0.083% IN NEBU: 2.5000 mg | INHALATION_SOLUTION | Freq: Four times a day (QID) | RESPIRATORY_TRACT | Status: DC | PRN

## 2021-10-16 MED ORDER — ONDANSETRON HCL 4 MG/2ML IJ SOLN
INTRAMUSCULAR | Status: DC | PRN
  Administered 2021-10-16: 4 mg via INTRAVENOUS

## 2021-10-16 MED ORDER — PANTOPRAZOLE SODIUM 40 MG PO TBEC
40.0000 mg | DELAYED_RELEASE_TABLET | Freq: Every day | ORAL | Status: DC
  Administered 2021-10-16 – 2021-10-18 (×3): 40 mg via ORAL
  Filled 2021-10-16 (×3): qty 1

## 2021-10-16 MED ORDER — METOPROLOL SUCCINATE ER 25 MG PO TB24
25.0000 mg | ORAL_TABLET | Freq: Every day | ORAL | Status: DC
  Administered 2021-10-18: 25 mg via ORAL
  Filled 2021-10-16: qty 1

## 2021-10-16 MED ORDER — LISINOPRIL 20 MG PO TABS
20.0000 mg | ORAL_TABLET | Freq: Every day | ORAL | Status: DC
  Administered 2021-10-16 – 2021-10-18 (×3): 20 mg via ORAL
  Filled 2021-10-16 (×3): qty 1

## 2021-10-16 MED ORDER — VITAMIN D (ERGOCALCIFEROL) 1.25 MG (50000 UNIT) PO CAPS
50000.0000 [IU] | ORAL_CAPSULE | ORAL | Status: DC
  Administered 2021-10-17: 50000 [IU] via ORAL
  Filled 2021-10-16: qty 1

## 2021-10-16 MED ORDER — ONDANSETRON HCL 4 MG/2ML IJ SOLN: 4.0000 mg | Freq: Once | INTRAMUSCULAR | Status: DC | PRN

## 2021-10-16 MED ORDER — LACTATED RINGERS IV SOLN: Freq: Once | INTRAVENOUS | Status: DC

## 2021-10-16 SURGICAL SUPPLY — 67 items
ALCOHOL 70% 16 OZ (MISCELLANEOUS) ×2 IMPLANT
BAG COUNTER SPONGE SURGICOUNT (BAG) ×2 IMPLANT
BIT DRILL 2.6 CANN (BIT) ×1 IMPLANT
BNDG COHESIVE 4X5 TAN STRL (GAUZE/BANDAGES/DRESSINGS) IMPLANT
BNDG COHESIVE 6X5 TAN STRL LF (GAUZE/BANDAGES/DRESSINGS) ×2 IMPLANT
BNDG ELASTIC 4X5.8 VLCR STR LF (GAUZE/BANDAGES/DRESSINGS) IMPLANT
BNDG ELASTIC 6X5.8 VLCR STR LF (GAUZE/BANDAGES/DRESSINGS) ×1 IMPLANT
COVER SURGICAL LIGHT HANDLE (MISCELLANEOUS) ×2 IMPLANT
CUFF TOURN SGL QUICK 34 (TOURNIQUET CUFF)
CUFF TOURN SGL QUICK 42 (TOURNIQUET CUFF) IMPLANT
CUFF TRNQT CYL 34X4.125X (TOURNIQUET CUFF) IMPLANT
DRAPE C-ARM 42X72 X-RAY (DRAPES) ×1 IMPLANT
DRAPE C-ARMOR (DRAPES) ×1 IMPLANT
DRAPE IMP U-DRAPE 54X76 (DRAPES) ×2 IMPLANT
DRAPE INCISE IOBAN 66X45 STRL (DRAPES) ×2 IMPLANT
DRAPE U-SHAPE 47X51 STRL (DRAPES) IMPLANT
DRSG AQUACEL AG ADV 3.5X10 (GAUZE/BANDAGES/DRESSINGS) ×1 IMPLANT
DURAPREP 26ML APPLICATOR (WOUND CARE) ×2 IMPLANT
ELECT CAUTERY BLADE 6.4 (BLADE) ×2 IMPLANT
ELECT REM PT RETURN 9FT ADLT (ELECTROSURGICAL) ×2
ELECTRODE REM PT RTRN 9FT ADLT (ELECTROSURGICAL) ×1 IMPLANT
FIBERTAPE 2 W/STRL NDL 17 (SUTURE) ×2 IMPLANT
GAUZE SPONGE 4X4 12PLY STRL (GAUZE/BANDAGES/DRESSINGS) ×2 IMPLANT
GAUZE XEROFORM 5X9 LF (GAUZE/BANDAGES/DRESSINGS) IMPLANT
GLOVE SRG 8 PF TXTR STRL LF DI (GLOVE) ×2 IMPLANT
GLOVE SURG ENC MOIS LTX SZ7.5 (GLOVE) ×4 IMPLANT
GLOVE SURG UNDER POLY LF SZ8 (GLOVE) ×2
GOWN STRL REUS W/ TWL LRG LVL3 (GOWN DISPOSABLE) ×1 IMPLANT
GOWN STRL REUS W/ TWL XL LVL3 (GOWN DISPOSABLE) ×2 IMPLANT
GOWN STRL REUS W/TWL LRG LVL3 (GOWN DISPOSABLE) ×1
GOWN STRL REUS W/TWL XL LVL3 (GOWN DISPOSABLE) ×2
GUIDEWIRE 1.35MM  DUAL TROCAR (WIRE) ×2
GUIDEWIRE 1.35MM DUAL TROCAR (WIRE) IMPLANT
KIT BASIN OR (CUSTOM PROCEDURE TRAY) ×2 IMPLANT
KIT TURNOVER KIT B (KITS) ×2 IMPLANT
NDL HYPO 25GX1X1/2 BEV (NEEDLE) IMPLANT
NEEDLE HYPO 25GX1X1/2 BEV (NEEDLE) IMPLANT
NS IRRIG 1000ML POUR BTL (IV SOLUTION) ×2 IMPLANT
PACK ORTHO EXTREMITY (CUSTOM PROCEDURE TRAY) ×2 IMPLANT
PAD ARMBOARD 7.5X6 YLW CONV (MISCELLANEOUS) ×4 IMPLANT
PAD CAST 3X4 CTTN HI CHSV (CAST SUPPLIES) IMPLANT
PADDING CAST COTTON 3X4 STRL (CAST SUPPLIES)
PADDING CAST COTTON 6X4 STRL (CAST SUPPLIES) IMPLANT
PASSER SUT SWANSON 36MM LOOP (INSTRUMENTS) IMPLANT
SCREW CANN BLUNT TIP LP 4X42 (Screw) ×1 IMPLANT
SCREW LO-PRO BLUNT TIP 4X40MM (Screw) ×1 IMPLANT
SPONGE T-LAP 18X18 ~~LOC~~+RFID (SPONGE) ×2 IMPLANT
STOCKINETTE IMPERVIOUS LG (DRAPES) ×2 IMPLANT
STRIP CLOSURE SKIN 1/2X4 (GAUZE/BANDAGES/DRESSINGS) ×1 IMPLANT
SUCTION FRAZIER HANDLE 10FR (MISCELLANEOUS) ×1
SUCTION TUBE FRAZIER 10FR DISP (MISCELLANEOUS) ×1 IMPLANT
SUT ETHIBOND 5 LR DA (SUTURE) ×6 IMPLANT
SUT ETHILON 2 0 FS 18 (SUTURE) IMPLANT
SUT ETHILON 3 0 PS 1 (SUTURE) IMPLANT
SUT FIBERWIRE #2 38 T-5 BLUE (SUTURE)
SUT MNCRL AB 3-0 PS2 18 (SUTURE) ×1 IMPLANT
SUT VIC AB 0 CT1 27 (SUTURE)
SUT VIC AB 0 CT1 27XBRD ANBCTR (SUTURE) IMPLANT
SUT VIC AB 2-0 CT1 27 (SUTURE)
SUT VIC AB 2-0 CT1 TAPERPNT 27 (SUTURE) IMPLANT
SUTURE FIBERWR #2 38 T-5 BLUE (SUTURE) IMPLANT
SYR CONTROL 10ML LL (SYRINGE) IMPLANT
TOWEL GREEN STERILE (TOWEL DISPOSABLE) ×4 IMPLANT
TOWEL GREEN STERILE FF (TOWEL DISPOSABLE) ×2 IMPLANT
TUBE CONNECTING 12X1/4 (SUCTIONS) ×2 IMPLANT
UNDERPAD 30X36 HEAVY ABSORB (UNDERPADS AND DIAPERS) ×2 IMPLANT
WATER STERILE IRR 1000ML POUR (IV SOLUTION) ×1 IMPLANT

## 2021-10-16 NOTE — Progress Notes (Signed)
Orthopedic Tech Progress Note Patient Details:  Lori Ayers 12/27/1965 [id number]  Called in stat order to [facility] for a [employer] BRACE  Patient ID: JANARIA MCCAMMON, female   DOB: 1966/02/05, 55 y.o.   MRN: [id number]  Donald Pore 10/16/2021, 2:47 PM

## 2021-10-16 NOTE — Discharge Instructions (Addendum)
Orthopedic surgery discharge instructions:  You will follow-up with Dr. Aundria Rud or Fuller Plan in 2 weeks at [facility].  Call [phone] to set this appointment up.  -For Blood clot prevention you should take 81 mg of aspirin twice a day.  Also leave your compression stocking on the left leg as this helps prevent blood clots as well.  Pump the ankles up and down to promote good circulation.  -Maintain your leg in the brace and fully locked in extension(locked straight)  at all times.  This will be opened up and we will progressively perform more range of motion once her therapist is ready for you to do so.  You may bear full weight on the leg with the brace on.  You should also maintain the brace at nighttime while sleeping.  -Elevate the leg when you are able and apply ice to the left knee for 20 to 30 minutes out of each hour that you are able and awake around-the-clock.  -You should take Tylenol and Advil in alternating fashion every 6 hours independent of each other to help reduce baseline pain.  You will then use your standard chronic pain medication as you typically would.  For any breakthrough pain you use the 2 mg tablets of Dilaudid.  Should also use the muscle relaxer as necessary. With your concerns about your pain contract I would contact her pain management provider before picking up the Dilaudid.  -You may begin showering on postoperative day #2.  Please do not submerge the left knee underwater.  He should also avoid putting full weight on the knee when the brace is off in the shower.  The bandage over the incision is waterproof, try to keep this on until follow-up in 2 weeks.  -You will return to see Dr. Aundria Rud in the office in 2 weeks postoperatively.

## 2021-10-16 NOTE — Transfer of Care (Signed)
Immediate Anesthesia Transfer of Care Note  Patient: Lori Ayers  Procedure(s) Performed: OPEN REDUCTION INTERNAL (ORIF) FIXATION PATELLA (Left: Knee)  Patient Location: PACU  Anesthesia Type:MAC and Spinal  Level of Consciousness: drowsy and patient cooperative  Airway & Oxygen Therapy: Patient Spontanous Breathing and Patient connected to face mask oxygen  Post-op Assessment: Report given to RN  Post vital signs: Reviewed and unstable  Last Vitals:  Vitals Value Taken Time  BP 75/55 10/16/21 1623  Temp    Pulse 82 10/16/21 1625  Resp 11 10/16/21 1625  SpO2 98 % 10/16/21 1625  Vitals shown include unvalidated device data.  Last Pain:  Vitals:   10/16/21 1317  TempSrc: Oral  PainSc:          Complications: No notable events documented.

## 2021-10-16 NOTE — Brief Op Note (Signed)
10/16/2021  3:58 PM  PATIENT:  Samaa S File  55 y.o. female  PRE-OPERATIVE DIAGNOSIS:  Left knee patella fracture, closed  POST-OPERATIVE DIAGNOSIS:  Left knee patella fracture, closed  PROCEDURE:  Procedure(s): OPEN REDUCTION INTERNAL (ORIF) FIXATION PATELLA (Left)  SURGEON:  Surgeon(s) and Role:    * Yolonda Kida, MD - Primary  PHYSICIAN ASSISTANT: Dion Saucier, PA-C   ANESTHESIA:   spinal and IV sedation  EBL:  30 mL   BLOOD ADMINISTERED:none  DRAINS: none   LOCAL MEDICATIONS USED:  NONE  SPECIMEN:  No Specimen  DISPOSITION OF SPECIMEN:  N/A  COUNTS:  YES  TOURNIQUET:  * Missing tourniquet times found for documented tourniquets in log: [id number] *  DICTATION: .Note written in EPIC  PLAN OF CARE: Admit for overnight observation  PATIENT DISPOSITION:  PACU - hemodynamically stable.   Delay start of Pharmacological VTE agent (>24hrs) due to surgical blood loss or risk of bleeding: not applicable

## 2021-10-16 NOTE — Anesthesia Procedure Notes (Signed)
Spinal  Patient location during procedure: OR Start time: 10/16/2021 2:53 PM End time: 10/16/2021 3:00 PM Reason for block: surgical anesthesia Staffing Performed: anesthesiologist  Anesthesiologist: Mal Amabile, MD Preanesthetic Checklist Completed: patient identified, IV checked, site marked, risks and benefits discussed, surgical consent, monitors and equipment checked, pre-op evaluation and timeout performed Spinal Block Patient position: sitting Prep: DuraPrep and site prepped and draped Patient monitoring: heart rate, cardiac monitor, continuous pulse ox and blood pressure Approach: midline Location: L3-4 Injection technique: single-shot Needle Needle type: Pencan  Needle gauge: 24 G Needle length: 9 cm Needle insertion depth: 6 cm Assessment Sensory level: T4 Events: CSF return Additional Notes Patient tolerated procedure well. Adequate sensory level. Technically difficult due to lumbar dextroscoliosis. Attempt x 2

## 2021-10-16 NOTE — Anesthesia Preprocedure Evaluation (Signed)
Anesthesia Evaluation  Patient identified by MRN, date of birth, ID band Patient awake    Reviewed: Allergy & Precautions, NPO status , Patient's Chart, lab work & pertinent test results, reviewed documented beta blocker date and time   Airway Mallampati: II  TM Distance: >3 FB Neck ROM: Full    Dental  (+) Edentulous Upper, Edentulous Lower   Pulmonary shortness of breath and with exertion, sleep apnea and Continuous Positive Airway Pressure Ventilation , COPD,  COPD inhaler, Current Smoker,    breath sounds clear to auscultation + decreased breath sounds      Cardiovascular hypertension, Pt. on medications and Pt. on home beta blockers + Past MI  Normal cardiovascular exam Rhythm:Regular Rate:Normal     Neuro/Psych negative neurological ROS  negative psych ROS   GI/Hepatic Neg liver ROS, GERD  Medicated,  Endo/Other  negative endocrine ROS  Renal/GU negative Renal ROS  negative genitourinary   Musculoskeletal  (+) Arthritis , Osteoarthritis,  Fx left patella   Abdominal   Peds  Hematology negative hematology ROS (+)   Anesthesia Other Findings   Reproductive/Obstetrics                             Anesthesia Physical Anesthesia Plan  ASA: 3  Anesthesia Plan: Spinal   Post-op Pain Management:    Induction:   PONV Risk Score and Plan: 2 and Treatment may vary due to age or medical condition, Propofol infusion and Ondansetron  Airway Management Planned: Natural Airway and Simple Face Mask  Additional Equipment:   Intra-op Plan:   Post-operative Plan:   Informed Consent: I have reviewed the patients History and Physical, chart, labs and discussed the procedure including the risks, benefits and alternatives for the proposed anesthesia with the patient or authorized representative who has indicated his/her understanding and acceptance.       Plan Discussed with:  Anesthesiologist and CRNA  Anesthesia Plan Comments:         Anesthesia Quick Evaluation

## 2021-10-16 NOTE — Op Note (Signed)
10/16/2021  4:01 PM  PATIENT:  Lori Ayers    PRE-OPERATIVE DIAGNOSIS:  Left knee patella fracture, closed  POST-OPERATIVE DIAGNOSIS:  Same  PROCEDURE:  OPEN REDUCTION INTERNAL (ORIF) FIXATION PATELLA, LEFT  SURGEON:  Yolonda Kida, MD  PHYSICIAN ASSISTANT: Dion Saucier, PA-C, present and scrubbed throughout the case, critical for completion in a timely fashion, and for retraction, instrumentation, and closure.  ANESTHESIA:  IV sedation and Spinal  PREOPERATIVE INDICATIONS:  Lori Ayers is a  55 y.o. female with a diagnosis of Left knee patella fracture who elected for surgical management in order to restore the function of the extensor mechanism.    The risks benefits and alternatives were discussed with the patient preoperatively including but not limited to the risks of infection, bleeding, nerve injury, cardiopulmonary complications, the need for revision surgery, hardware prominence, hardware failure, the need for hardware removal, nonunion, malunion, posttraumatic arthritis, stiffness, loss of strength and function, among others, and the patient was willing to proceed.  OPERATIVE IMPLANTS: Arthrex, 4.0 mm cannulated screws x2 with a  FiberTape suture in a figure-of-eight cerclage fashion  OPERATIVE FINDINGS: Displaced patella fracture, transverse  OPERATIVE PROCEDURE: The patient was brought to the operating room and placed in the supine position. General anesthesia was administered. IV antibiotics were given. The lower extremity was prepped and draped in usual sterile fashion. The leg was elevated and exsanguinated and the tourniquet was inflated. Time out was performed.   Anterior incision was made over the patella and the fracture fragments identified and cleaned of hematoma. The retinaculum was torn on either side.  I reduced the fracture anatomically and held provisionally with a clamp and placed 2 guidewires for the cannulated screws.  The lengths were measured,  after being confirmed on C-arm, and then I placed the screws, taking care to make sure that there were threads only on the proximal segment, providing compression at the fracture site.  We did note that the lateral screw was a little bit proud distal.  However these are blunt tipped screws and on palpation of the soft tissue envelope you could barely feel that screw tip so we did not feel it was necessary to change the length.  Immediately the screw was contained within the patella.  C-arm used to confirm reduction and position of the screws, and once I was satisfied with this I then used a Keith needle through the screws bringing a FiberTape suture in a figure-of-eight type fashion. This provided excellent secondary fixation.   The wounds were irrigated copiously and the retinaculum repaired with Vicryl followed by Vicryl for the subcutaneous tissue with Steri-Strips and sterile gauze for the skin. The wounds were also injected. A knee Bledsoe brace was applied. The patient was awakened and returned to the PACU in stable and satisfactory condition. There were no complications.   Disposition:  Lori Ayers will be weightbearing as tolerated in the knee brace while it is locked in full extension.  She will use an assistive device.  Due to some preoperative concern from the anesthesiologist on her EKG we will keep her overnight and ask for a cardiology consultation in the morning.  There was some concern for active ischemia and chronic or remote ischemic event.  We will keep her inpatient if need be but otherwise we will try to get her out of the hospital tomorrow if that is appropriate.

## 2021-10-16 NOTE — H&P (Signed)
[facility] and P  REQUESTING PHYSICIAN: Yolonda Kida, MD  PCP:  Treasa School, PA-C  Chief Complaint: Left patella fracture  HPI: Lori Ayers is a 55 y.o. female who complains of left knee pain following a ground-level fall back on November 5.  She presented to the clinic for evaluation and treatment recommendations.  She did sustain a transverse patella fracture with displacement.  She is here today for open reduction internal fixation.  She does have history of 1 pack/day of smoking as well as myocardial infarction following a surgery few years back.  She states that there was no cardiac intervention undertaken and has no longer been on any blood thinners.  Past Medical History:  Diagnosis Date   Arthritis    Chronic back pain    COPD (chronic obstructive pulmonary disease) (HCC)    CPAP (continuous positive airway pressure) dependence    Dyspnea    GERD (gastroesophageal reflux disease)    Hypertension    Myocardial infarction (HCC)    Scoliosis    Sleep apnea    Past Surgical History:  Procedure Laterality Date   APPENDECTOMY     TONSILLECTOMY     TOTAL HIP ARTHROPLASTY Right 2016   Social History   Socioeconomic History   Marital status: Married    Spouse name: Not on file   Number of children: Not on file   Years of education: Not on file   Highest education level: Not on file  Occupational History   Not on file  Tobacco Use   Smoking status: Every Day    Packs/day: 1.00    Years: 40.00    Pack years: 40.00    Types: Cigarettes   Smokeless tobacco: Never  Vaping Use   Vaping Use: Never used  Substance and Sexual Activity   Alcohol use: No   Drug use: No   Sexual activity: Not on file  Other Topics Concern   Not on file  Social History Narrative   Not on file   Social Determinants of Health   Financial Resource Strain: Not on file  Food Insecurity: Not on file  Transportation Needs: Not on file  Physical Activity: Not on file   Stress: Not on file  Social Connections: Not on file   History reviewed. No pertinent family history. Allergies  Allergen Reactions   Gabapentin Other (See Comments)    Leg and arm jerking at bedtime    Prior to Admission medications   Medication Sig Start Date End Date Taking? Authorizing Provider  albuterol (VENTOLIN HFA) 108 (90 Base) MCG/ACT inhaler Inhale 2 puffs into the lungs every 6 (six) hours as needed for wheezing or shortness of breath.   Yes [provider]  amitriptyline (ELAVIL) 50 MG tablet Take 50-100 mg by mouth at bedtime.   Yes [provider]  busPIRone (BUSPAR) 15 MG tablet Take 15 mg by mouth 3 (three) times daily.   Yes [provider]  fluticasone furoate-vilanterol (BREO ELLIPTA) 100-25 MCG/ACT AEPB Inhale 1 puff into the lungs daily as needed (asthma).   Yes [provider]  lisinopril (ZESTRIL) 20 MG tablet Take 20 mg by mouth daily.   Yes [provider]  metoprolol succinate (TOPROL-XL) 25 MG 24 hr tablet Take 25 mg by mouth daily.   Yes [provider]  omeprazole (PRILOSEC) 40 MG capsule Take 40 mg by mouth daily.   Yes [provider]  Oxycodone HCl 20 MG TABS Take 1 tablet by  mouth 5 (five) times daily as needed. 09/17/21  Yes [provider]  Vitamin D, Ergocalciferol, (DRISDOL) 1.25 MG (50000 UNIT) CAPS capsule Take 50,000 Units by mouth every Thursday.   Yes [provider]   DG Knee Complete 4 Views Left  Result Date: 10/14/2021 CLINICAL DATA:  Knee pain with limited weight-bearing after slipping and falling last night. EXAM: LEFT KNEE - COMPLETE 4+ VIEW COMPARISON:  Limited correlation made with CT of the left thigh 10/28/2013. FINDINGS: There is an acute, mildly comminuted nearly transverse fracture through the patella. This is associated with up to 9 mm of distraction of the fracture fragments. The patella is located. There is posttraumatic deformity of the proximal  fibular diaphysis consistent with an old healed fracture. No other acute fracture or dislocation identified. There is a large lipohemarthrosis. IMPRESSION: Comminuted and mildly displaced fracture of the patella with associated lipohemarthrosis. Electronically Signed   By: Carey Bullocks M.D.   On: 10/14/2021 14:50    Positive ROS: All other systems have been reviewed and were otherwise negative with the exception of those mentioned in the HPI and as above.  Physical Exam: General: Alert, no acute distress Cardiovascular: No pedal edema Respiratory: No cyanosis, no use of accessory musculature GI: No organomegaly, abdomen is soft and non-tender Skin: No lesions in the area of chief complaint Neurologic: Sensation intact distally Psychiatric: Patient is competent for consent with normal mood and affect Lymphatic: No axillary or cervical lymphadenopathy  MUSCULOSKELETAL: Left lower extremity demonstrates moderate hemarthrosis at the knee.  Otherwise no open wounds.  Distally neurovascular intact.  Assessment: Left transverse patella fracture displaced.  Plan: Plan to proceed today with open reduction internal fixation of left patella fracture.  We discussed the risk and benefits of the procedure at length.  We discussed the risk of bleeding, infection, damage to surrounding nerves and vessels, stiffness, failure repair, arthritis, development of DVT, need for further surgery, malunion, nonunion, as well as the risk of anesthesia.  She has provided informed consent.   -Based on cardiac history we will keep her overnight for cardiac monitoring and pain control.  Plan will discharge home tomorrow.    Yolonda Kida, MD Cell [phone]    10/16/2021 2:40 PM

## 2021-10-16 NOTE — Consult Note (Addendum)
Cardiology Consultation:   Patient ID: Lori Ayers MRN: [id number]; DOB: 04-28-1966  Admit date: 10/16/2021 Date of Consult: 10/16/2021  PCP:  Treasa School, PA-C   [facility] Providers Cardiologist:  New - seen by Dr. Molly Maduro once in 2019   Patient Profile:   Lori Ayers is a 55 y.o. female with a hx of chronic tobacco abuse, COPD, hypertension and obstructive sleep apnea who is being seen 10/16/2021 for the evaluation of abnormal EKG at the request of Dr. Malen Gauze.  History of Present Illness:   Lori Ayers is a 55 year old female with past medical history of chronic tobacco abuse, COPD, hypertension and obstructive sleep apnea.  She has been smoking 1 pack/day since age 16.  Her mother recently passed away from pancreatic cancer.  She does not know her father.  Although myocardial infarction is listed as a diagnosis in her past medical history, she does not remember ever having a cardiac catheterization procedure.  When she underwent hip surgery in 2016, she was told that she likely had a small heart attack in the past (based on the EKG?).  Echocardiogram obtained on 02/21/2015 showed EF of 65 to 70%, no significant valve issue. Myoview obtained on 02/23/2015 showed EF 73%, normal adenosine stress test but no evidence of inducible ischemia.  Patient was seen by Dr. Abelardo Diesel of cardiology service in [place] on 11/17/2018 for dyspnea on exertion.  According to Dr. Luevenia Maxin note, her EKG was normal at the time and she had a normal echocardiogram in 2016 and another one more recently prior to the interview at [facility].  No further cardiac evaluation was recommended.  Her dyspnea on exertion was felt to be related to tobacco abuse and the chronic obstructive pulmonary disease.   Patient underwent ORIF today for left knee patella fracture.  Presurgical EKG showed normal sinus rhythm, T wave inversion in V1 through V4 and inferior Q-wave.  No prior EKG was available to  compare.  Patient underwent her surgery without any complication.  Postprocedure, cardiology service was consulted for abnormal EKG.  Talking with the patient, she does have occasional chest discomfort, she is very vague about her symptom however suspect she has more chest discomfort when she over exert herself.  She is still able to do every day activity without any issue.  Last episode of chest pain was over 3 weeks ago.  She has not had any significant prolonged chest pain at rest recently.   Past Medical History:  Diagnosis Date   Arthritis    Chronic back pain    COPD (chronic obstructive pulmonary disease) (HCC)    CPAP (continuous positive airway pressure) dependence    Dyspnea    GERD (gastroesophageal reflux disease)    Hypertension    Myocardial infarction [facility])    Scoliosis    Sleep apnea     Past Surgical History:  Procedure Laterality Date   APPENDECTOMY     TONSILLECTOMY     TOTAL HIP ARTHROPLASTY Right 2016     Home Medications:  Prior to Admission medications   Medication Sig Start Date End Date Taking? Authorizing Provider  albuterol (VENTOLIN HFA) 108 (90 Base) MCG/ACT inhaler Inhale 2 puffs into the lungs every 6 (six) hours as needed for wheezing or shortness of breath.   Yes [provider]  amitriptyline (ELAVIL) 50 MG tablet Take 50-100 mg by mouth at bedtime.   Yes [provider]  busPIRone (BUSPAR) 15 MG tablet Take 15 mg by mouth 3 (  three) times daily.   Yes [provider]  fluticasone furoate-vilanterol (BREO ELLIPTA) 100-25 MCG/ACT AEPB Inhale 1 puff into the lungs daily as needed (asthma).   Yes [provider]  HYDROmorphone (DILAUDID) 2 MG tablet Take 1 tablet (2 mg total) by mouth every 4 (four) hours as needed for up to 5 days for severe pain. 10/16/21 10/21/21 Yes Yolonda Kida, MD  lisinopril (ZESTRIL) 20 MG tablet Take 20 mg by mouth daily.   Yes [provider]  methocarbamol (ROBAXIN) 500 MG  tablet Take 1 tablet (500 mg total) by mouth every 6 (six) hours as needed for muscle spasms. 10/16/21  Yes Yolonda Kida, MD  metoprolol succinate (TOPROL-XL) 25 MG 24 hr tablet Take 25 mg by mouth daily.   Yes [provider]  omeprazole (PRILOSEC) 40 MG capsule Take 40 mg by mouth daily.   Yes [provider]  Oxycodone HCl 20 MG TABS Take 1 tablet by mouth 5 (five) times daily as needed. 09/17/21  Yes [provider]  Vitamin D, Ergocalciferol, (DRISDOL) 1.25 MG (50000 UNIT) CAPS capsule Take 50,000 Units by mouth every Thursday.   Yes [provider]    Inpatient Medications: Scheduled Meds:  Continuous Infusions:  lactated ringers 10 mL/hr at 10/16/21 1336   lactated ringers     phenylephrine (NEO-SYNEPHRINE) Adult infusion 15 mcg/min (10/16/21 1709)   PRN Meds: HYDROmorphone (DILAUDID) injection, ondansetron (ZOFRAN) IV  Allergies:    Allergies  Allergen Reactions   Gabapentin Other (See Comments)    Leg and arm jerking at bedtime     Social History:   Social History   Socioeconomic History   Marital status: Married    Spouse name: Not on file   Number of children: Not on file   Years of education: Not on file   Highest education level: Not on file  Occupational History   Not on file  Tobacco Use   Smoking status: Every Day    Packs/day: 1.00    Years: 40.00    Pack years: 40.00    Types: Cigarettes   Smokeless tobacco: Never  Vaping Use   Vaping Use: Never used  Substance and Sexual Activity   Alcohol use: No   Drug use: No   Sexual activity: Not on file  Other Topics Concern   Not on file  Social History Narrative   Not on file   Social Determinants of Health   Financial Resource Strain: Not on file  Food Insecurity: Not on file  Transportation Needs: Not on file  Physical Activity: Not on file  Stress: Not on file  Social Connections: Not on file  Intimate Partner Violence: Not on file    Family  History:   Family History  Problem Relation Age of Onset   Cancer Mother    Hypertension Mother      ROS:  Please see the history of present illness.   All other ROS reviewed and negative.     Physical Exam/Data:   Vitals:   10/16/21 1715 10/16/21 1721 10/16/21 1730 10/16/21 1736  BP:  105/65  95/61  Pulse: 63 64 67 67  Resp: 10 11 10 12   Temp:  (!) 97.1 F (36.2 C)    TempSrc:      SpO2: 97% 97% 94% 94%  Weight:      Height:        Intake/Output Summary (Last 24 hours) at 10/16/2021 1752 Last data filed at 10/16/2021 1620 Gross  per 24 hour  Intake 400 ml  Output 30 ml  Net 370 ml   Last 3 Weights 10/16/2021 10/14/2021  Weight (lbs) 130 lb 135 lb  Weight (kg) 58.968 kg 61.236 kg     Body mass index is 23.78 kg/m.  General:  Well nourished, well developed, in no acute distress HEENT: normal Neck: no JVD Vascular: No carotid bruits; Distal pulses 2+ bilaterally Cardiac:  normal S1, S2; RRR; no murmur  Lungs:  clear to auscultation bilaterally, no wheezing, rhonchi or rales  Abd: soft, nontender, no hepatomegaly  Ext: no edema Musculoskeletal:  No deformities, BUE and BLE strength normal and equal Skin: warm and dry  Neuro:  CNs 2-12 intact, no focal abnormalities noted Psych:  Normal affect   EKG:  The EKG was personally reviewed and demonstrates: Normal sinus rhythm with T wave inversion in V1-V4 and Q waves in the inferior lead Telemetry:  Telemetry was personally reviewed and demonstrates: Not on central telemetry.  Overhead telemetry shows sinus rhythm, however unable to pull up the previous record  Relevant CV Studies:  N/A  Laboratory Data:  High Sensitivity Troponin:  No results for input(s): TROPONINIHS in the last 720 hours.   Chemistry Recent Labs  Lab 10/16/21 1305  NA 135  K 3.7  CL 97*  CO2 27  GLUCOSE 114*  BUN 5*  CREATININE 0.60  CALCIUM 9.0  GFRNONAA >60  ANIONGAP 11    No results for input(s): PROT, ALBUMIN, AST, ALT, ALKPHOS,  BILITOT in the last 168 hours. Lipids No results for input(s): CHOL, TRIG, HDL, LABVLDL, LDLCALC, CHOLHDL in the last 168 hours.  Hematology Recent Labs  Lab 10/16/21 1305  WBC 6.8  RBC 3.89  HGB 15.3*  HCT 43.7  MCV 112.3*  MCH 39.3*  MCHC 35.0  RDW 14.5  PLT PLATELET CLUMPS NOTED ON SMEAR, UNABLE TO ESTIMATE   Thyroid No results for input(s): TSH, FREET4 in the last 168 hours.  BNPNo results for input(s): BNP, PROBNP in the last 168 hours.  DDimer No results for input(s): DDIMER in the last 168 hours.   Radiology/Studies:  DG Knee 1-2 Views Right  Result Date: 10/16/2021 CLINICAL DATA:  Intraoperative open reduction internal fixation patellar fracture EXAM: RIGHT KNEE - 1-2 VIEW COMPARISON:  None. FINDINGS: Intraoperative fluoroscopic images of left patellar fracture fixation with 2 screws. Fluoroscopic time was 21 seconds. Total fluoroscopic dose was 1.09 mGy. IMPRESSION: Intraoperative use of fluoroscopy for fixation of patellar fracture. Electronically Signed   By: Larose Hires D.O.   On: 10/16/2021 16:20   DG Knee Complete 4 Views Left  Result Date: 10/14/2021 CLINICAL DATA:  Knee pain with limited weight-bearing after slipping and falling last night. EXAM: LEFT KNEE - COMPLETE 4+ VIEW COMPARISON:  Limited correlation made with CT of the left thigh 10/28/2013. FINDINGS: There is an acute, mildly comminuted nearly transverse fracture through the patella. This is associated with up to 9 mm of distraction of the fracture fragments. The patella is located. There is posttraumatic deformity of the proximal fibular diaphysis consistent with an old healed fracture. No other acute fracture or dislocation identified. There is a large lipohemarthrosis. IMPRESSION: Comminuted and mildly displaced fracture of the patella with associated lipohemarthrosis. Electronically Signed   By: Carey Bullocks M.D.   On: 10/14/2021 14:50   DG C-Arm 1-60 Min-No Report  Result Date: 10/16/2021 Fluoroscopy  was utilized by the requesting physician.  No radiographic interpretation.     Assessment and Plan:  Abnormal EKG  -Previous surgical EKG showed sinus rhythm with T wave inversion in the anterior leads and Q waves in the inferior lead.  Although she carries a diagnosis of myocardial infarction, however she has never had a cardiac catheterization in the past.  She was told back in 2015 that she might had a small heart attack in the past.  However when she was seen by Dr. Molly Maduro for dyspnea on exertion in 2019, Dr. Molly Maduro suggested her EKG was normal and echocardiogram in 2016 and a more recent echo prior to the visit were normal.  -Talking with the patient, she has vague chest discomfort that tend to occur more so with more strenuous activity but not with every day activity.  Her renal function is stable, will discuss with MD, potentially consider coronary CT either as inpatient or outpatient.  Since her last chest pain was over 3 weeks ago, ischemic work-up is not urgent in this case.   Left patellar fracture: Underwent ORIF  COPD: Related to tobacco abuse.  Tobacco abuse: 1 pack/day since age 76  Hypertension: On lisinopril and metoprolol succinate at home.  Blood pressure low after surgery, ordered 250 mL of IV fluid for the patient.  Obstructive sleep apnea   Risk Assessment/Risk Scores:        For questions or updates, please contact [facility] Please consult [email or web address] for contact info under    Signed, Azalee Course, [place]  10/16/2021 5:52 PM  History and all data above reviewed.  Patient examined.  I agree with the findings as above.  The patient has a past cardiac history of a "abnormal EKG".  We cannot find significant descriptors of this and cannot get old tracings.  We do see that in 2016 she had a perfusion study that was normal.  She does have significant cardiovascular risk factors.  She has a markedly abnormal EKG as above.  She gets dyspneic walking 20 yards up an  incline to the trash.  She may get some chest discomfort with this.  This has been slowly progressive.  She does not describe resting shortness of breath, PND or orthopnea.  She has not had any palpitations, presyncope or syncope.  She does have sleep apnea and she cannot use a CPAP.  She has claustrophobia.  She has been told she has chronic lung disease.  She has ongoing tobacco abuse.  The patient exam reveals COR:RRR  ,  Lungs: Clear  ,  Abd: Positive bowel sounds, no rebound no guarding, Ext No edema  .  All available labs, radiology testing, previous records reviewed. Agree with documented assessment and plan.  Abnormal EKG: The patient has a markedly abnormal EKG.  I will check a troponin on the patient though I suspect these might be some chronic changes.  However, given the chest pain she is describing, the EKG and her risk factors obstructive coronary disease needs to be excluded.  We will obtain a coronary CT angiogram in the morning.  I will order serial troponins.  Lori Ayers  6:50 PM  10/16/2021

## 2021-10-16 NOTE — Anesthesia Postprocedure Evaluation (Signed)
Anesthesia Post Note  Patient: Lori Ayers  Procedure(s) Performed: OPEN REDUCTION INTERNAL (ORIF) FIXATION PATELLA (Left: Knee)     Patient location during evaluation: PACU Anesthesia Type: Spinal Level of consciousness: oriented and awake and alert Pain management: pain level controlled Vital Signs Assessment: post-procedure vital signs reviewed and stable Respiratory status: spontaneous breathing, respiratory function stable and nonlabored ventilation Cardiovascular status: blood pressure returned to baseline and stable Postop Assessment: no headache, no backache, no apparent nausea or vomiting and spinal receding Anesthetic complications: no   No notable events documented.  Last Vitals:  Vitals:   10/16/21 1736 10/16/21 1751  BP: 95/61 (!) 98/58  Pulse: 67 65  Resp: 12 12  Temp:    SpO2: 94% 93%    Last Pain:  Vitals:   10/16/21 1751  TempSrc:   PainSc: 0-No pain                 [name] A.

## 2021-10-16 NOTE — Progress Notes (Signed)
Pt. Arrived on unit at 1827, A&0 X4 helped order dinner family at bedside.

## 2021-10-16 NOTE — Anesthesia Procedure Notes (Signed)
Procedure Name: MAC Date/Time: 10/16/2021 2:50 PM Performed by: Barrington Ellison, CRNA Pre-anesthesia Checklist: Patient identified, Emergency Drugs available, Suction available, Patient being monitored and Timeout performed Patient Re-evaluated:Patient Re-evaluated prior to induction Oxygen Delivery Method: Simple face mask

## 2021-10-17 ENCOUNTER — Observation Stay (HOSPITAL_COMMUNITY): Payer: Medicaid Other

## 2021-10-17 ENCOUNTER — Encounter (HOSPITAL_COMMUNITY): Payer: Self-pay | Admitting: Orthopedic Surgery

## 2021-10-17 DIAGNOSIS — R072 Precordial pain: Secondary | ICD-10-CM | POA: Diagnosis not present

## 2021-10-17 DIAGNOSIS — R931 Abnormal findings on diagnostic imaging of heart and coronary circulation: Secondary | ICD-10-CM | POA: Diagnosis not present

## 2021-10-17 DIAGNOSIS — S82032A Displaced transverse fracture of left patella, initial encounter for closed fracture: Secondary | ICD-10-CM | POA: Diagnosis not present

## 2021-10-17 DIAGNOSIS — I2510 Atherosclerotic heart disease of native coronary artery without angina pectoris: Secondary | ICD-10-CM | POA: Diagnosis not present

## 2021-10-17 DIAGNOSIS — R9431 Abnormal electrocardiogram [ECG] [EKG]: Secondary | ICD-10-CM | POA: Diagnosis not present

## 2021-10-17 MED ORDER — METOPROLOL TARTRATE 50 MG PO TABS
100.0000 mg | ORAL_TABLET | Freq: Once | ORAL | Status: AC
  Administered 2021-10-17: 100 mg via ORAL
  Filled 2021-10-17: qty 2

## 2021-10-17 MED ORDER — KETOROLAC TROMETHAMINE 30 MG/ML IJ SOLN
30.0000 mg | Freq: Four times a day (QID) | INTRAMUSCULAR | Status: AC
  Administered 2021-10-17 – 2021-10-18 (×4): 30 mg via INTRAVENOUS
  Filled 2021-10-17 (×4): qty 1

## 2021-10-17 MED ORDER — OXYCODONE HCL 5 MG PO TABS
20.0000 mg | ORAL_TABLET | ORAL | Status: DC | PRN
  Administered 2021-10-17 – 2021-10-18 (×4): 20 mg via ORAL
  Filled 2021-10-17 (×6): qty 4

## 2021-10-17 MED ORDER — IOHEXOL 350 MG/ML SOLN
90.0000 mL | Freq: Once | INTRAVENOUS | Status: AC | PRN
  Administered 2021-10-17: 90 mL via INTRAVENOUS

## 2021-10-17 MED ORDER — NITROGLYCERIN 0.4 MG SL SUBL
SUBLINGUAL_TABLET | SUBLINGUAL | Status: AC
  Filled 2021-10-17: qty 2

## 2021-10-17 NOTE — Progress Notes (Signed)
N  Progress Note  Patient Name: Lori Ayers Date of Encounter: 10/17/2021  Primary Cardiologist:   None   Subjective   No chest pain.  No SOB.    Inpatient Medications    Scheduled Meds:  amitriptyline  50-100 mg Oral QHS   busPIRone  15 mg Oral TID   docusate sodium  100 mg Oral BID   enoxaparin (LOVENOX) injection  40 mg Subcutaneous Q24H   ketorolac  30 mg Intravenous Q6H   lisinopril  20 mg Oral Daily   metoprolol succinate  25 mg Oral Daily   nitroGLYCERIN       pantoprazole  40 mg Oral Daily   traMADol  50 mg Oral Q6H   Vitamin D (Ergocalciferol)  50,000 Units Oral Q Thu   Continuous Infusions:   ceFAZolin (ANCEF) IV 1 g (10/17/21 0321)   methocarbamol (ROBAXIN) IV     PRN Meds: [START ON 10/18/2021] acetaminophen, albuterol, fluticasone furoate-vilanterol, HYDROmorphone (DILAUDID) injection, methocarbamol **OR** methocarbamol (ROBAXIN) IV, metoCLOPramide **OR** metoCLOPramide (REGLAN) injection, ondansetron **OR** ondansetron (ZOFRAN) IV, oxyCODONE, oxyCODONE   Vital Signs    Vitals:   10/16/21 2219 10/17/21 0200 10/17/21 0416 10/17/21 0759  BP: (!) 128/99 116/76 (!) 142/96 (!) 151/95  Pulse: 77 81 84 77  Resp: 19 18 18 17   Temp: 98 F (36.7 C) 98.1 F (36.7 C) 98.1 F (36.7 C) 97.9 F (36.6 C)  TempSrc: Oral Oral Oral Oral  SpO2: 93% 94% 97% 92%  Weight:      Height:        Intake/Output Summary (Last 24 hours) at 10/17/2021 1409 Last data filed at 10/16/2021 1620 Gross per 24 hour  Intake 400 ml  Output 30 ml  Net 370 ml   Filed Weights   10/16/21 1317  Weight: 59 kg    Telemetry    NA - Personally Reviewed  ECG    NA - Personally Reviewed  Physical Exam   GEN: No acute distress.   Neck: No  JVD Cardiac: RRR, no murmurs, rubs, or gallops.  Respiratory: Clear  to auscultation bilaterally. GI: Soft, nontender, non-distended  MS: No  edema; No deformity. Neuro:  Nonfocal  Psych: Normal affect   Labs    Chemistry Recent  Labs  Lab 10/16/21 1305 10/16/21 1829  NA 135  --   K 3.7  --   CL 97*  --   CO2 27  --   GLUCOSE 114*  --   BUN 5*  --   CREATININE 0.60 0.69  CALCIUM 9.0  --   GFRNONAA >60 >60  ANIONGAP 11  --      Hematology Recent Labs  Lab 10/16/21 1305 10/16/21 1829  WBC 6.8 5.3  RBC 3.89 3.31*  HGB 15.3* 13.1  HCT 43.7 37.1  MCV 112.3* 112.1*  MCH 39.3* 39.6*  MCHC 35.0 35.3  RDW 14.5 14.5  PLT PLATELET CLUMPS NOTED ON SMEAR, UNABLE TO ESTIMATE 89*    Cardiac EnzymesNo results for input(s): TROPONINI in the last 168 hours. No results for input(s): TROPIPOC in the last 168 hours.   BNPNo results for input(s): BNP, PROBNP in the last 168 hours.   DDimer No results for input(s): DDIMER in the last 168 hours.   Radiology    DG Knee 1-2 Views Right  Result Date: 10/16/2021 CLINICAL DATA:  Intraoperative open reduction internal fixation patellar fracture EXAM: RIGHT KNEE - 1-2 VIEW COMPARISON:  None. FINDINGS: Intraoperative fluoroscopic images of left patellar fracture fixation with  2 screws. Fluoroscopic time was 21 seconds. Total fluoroscopic dose was 1.09 mGy. IMPRESSION: Intraoperative use of fluoroscopy for fixation of patellar fracture. Electronically Signed   By: Larose Hires D.O.   On: 10/16/2021 16:20   DG C-Arm 1-60 Min-No Report  Result Date: 10/16/2021 Fluoroscopy was utilized by the requesting physician.  No radiographic interpretation.    Cardiac Studies   CT pending.   Patient Profile     55 y.o. female with no past cardiac history other than a negative stress test in 2016.  We are called because of T wave inversion on her EKG.  She has had chest pain with exertion recently and chronically and she has multiple risk factors.    Assessment & Plan    CHEST PAIN:  Abnormal EKG as noted in consult.  CT pending.  Further work up and risk reduction suggestions pending these results.    TOBACCO:  Educated.     HTN:  Home meds resumed.  BP is up slightly.  She  might need med titration prior to discharge.   For questions or updates, please contact [facility] Please consult [email or web address] for contact info under Cardiology/STEMI.   Signed, Rollene Rotunda, MD  10/17/2021, 2:09 PM

## 2021-10-17 NOTE — Progress Notes (Signed)
Partial relief noted from additional pain meds. Pain now 7/10. Pt reports pain is now radiating to her foot, tingling and increasingly numb. Upon reassessment, foot warm to touch, 1+ pedal pulse, cap refill <3 seconds. When questioned if this just started, pt reports "it felt a little numb earlier but its getting worst and now the pain is in my foot". Caryn Bee McClung-PA notified, advised to elevate foot, ankle pump exercises, with administration of prn dilaudid. Based on assessment, no new orders at this time. Will continue to monitor.

## 2021-10-17 NOTE — Progress Notes (Signed)
Patient to CT at this time

## 2021-10-17 NOTE — Progress Notes (Signed)
Pt reports no relief from scheduled and prn medications administered. Reports pain 9/10-10/10 with activity. Eartha Inch notified, with new orders noted. Will continue to monitor for effectiveness.

## 2021-10-17 NOTE — Evaluation (Signed)
Physical Therapy Evaluation Patient Details Name: Lori Ayers MRN: [id number] DOB: 1966-01-17 Today's Date: 10/17/2021  History of Present Illness  55 yo female presents to ED on 11/7 with fall sustaining L patellar fracture, s/p ORIF on 11/9. PMH includes OA, tobacco use, COPD, GERD, HTN, MI, sleep apnea, R THA.   Clinical Impression  Pt presents with impaired strength, severe LLE pain, difficulty performing mobility tasks, and decreased activity tolerance. Pt to benefit from acute PT to address deficits. Pt tolerated stand pivot to and from [facility] only today, requiring light PT assist to perform. At baseline pt is independent with mobility, anticipate follow up PT needs. PT to progress mobility as tolerated, and will continue to follow acutely.         Recommendations for follow up therapy are one component of a multi-disciplinary discharge planning process, led by the attending physician.  Recommendations may be updated based on patient status, additional functional criteria and insurance authorization.  Follow Up Recommendations Home health PT    Assistance Recommended at Discharge Frequent or constant Supervision/Assistance  Functional Status Assessment Patient has had a recent decline in their functional status and demonstrates the ability to make significant improvements in function in a reasonable and predictable amount of time.  Equipment Recommendations  BSC/3in1    Recommendations for Other Services       Precautions / Restrictions Precautions Precautions: Fall Required Braces or Orthoses: Knee Immobilizer - Left Knee Immobilizer - Left: On at all times;Other (comment) (bledsoe brace locked in extension) Restrictions Weight Bearing Restrictions: Yes LLE Weight Bearing: Weight bearing as tolerated      Mobility  Bed Mobility Overal bed mobility: Needs Assistance Bed Mobility: Supine to Sit;Sit to Supine     Supine to sit: Min assist;HOB elevated Sit to supine: Min  assist;HOB elevated   General bed mobility comments: assist for LLE lifting and translation in and out of bed.    Transfers Overall transfer level: Needs assistance Equipment used: Rolling walker (2 wheels);1 person hand held assist Transfers: Sit to/from Stand;Bed to chair/wheelchair/BSC Sit to Stand: Min assist Stand pivot transfers: Min assist         General transfer comment: min assist for rise, steady, and steadying trunk during stand pivot to/from [facility]. Pt tolerating x2 steps only forward and back secondary to severe LLE pain, declines gait training.    Ambulation/Gait                  Stairs            Wheelchair Mobility    Modified Rankin (Stroke Patients Only)       Balance Overall balance assessment: Needs assistance;History of Falls Sitting-balance support: No upper extremity supported;Feet supported Sitting balance-Leahy Scale: Fair     Standing balance support: Bilateral upper extremity supported;During functional activity Standing balance-Leahy Scale: Poor                               Pertinent Vitals/Pain Pain Assessment: 0-10 Pain Score: 9  Pain Location: L leg Pain Descriptors / Indicators: Sore;Discomfort Pain Intervention(s): Limited activity within patient's tolerance;Monitored during session;Repositioned    Home Living Family/patient expects to be discharged to:: Private residence Living Arrangements: Spouse/significant other Available Help at Discharge: Family Type of Home: House Home Access: Stairs to enter Entrance Stairs-Rails: None Entrance Stairs-Number of Steps: 2   Home Layout: One level Home Equipment: Agricultural consultant (2 wheels);Crutches  Prior Function Prior Level of Function : Independent/Modified Independent                     Hand Dominance   Dominant Hand: Right    Extremity/Trunk Assessment   Upper Extremity Assessment Upper Extremity Assessment: Defer to OT evaluation     Lower Extremity Assessment Lower Extremity Assessment: Generalized weakness;LLE deficits/detail LLE Deficits / Details: weak DF suspect secondary to pain LLE: Unable to fully assess due to immobilization    Cervical / Trunk Assessment Cervical / Trunk Assessment: Normal  Communication   Communication: No difficulties  Cognition Arousal/Alertness: Awake/alert Behavior During Therapy: WFL for tasks assessed/performed Overall Cognitive Status: Within Functional Limits for tasks assessed                                          General Comments General comments (skin integrity, edema, etc.): on 2LO2 via [place], SPO2 86-95% during transfers    Exercises General Exercises - Lower Extremity Ankle Circles/Pumps: AROM;Both;5 reps;Supine   Assessment/Plan    PT Assessment Patient needs continued PT services  PT Problem List Decreased strength;Decreased mobility;Decreased activity tolerance;Decreased balance;Decreased knowledge of use of DME;Pain;Decreased safety awareness       PT Treatment Interventions DME instruction;Therapeutic activities;Gait training;Therapeutic exercise;Patient/family education;Balance training;Stair training;Functional mobility training;Neuromuscular re-education    PT Goals (Current goals can be found in the Care Plan section)  Acute Rehab PT Goals Patient Stated Goal: home PT Goal Formulation: With patient Time For Goal Achievement: 10/31/21 Potential to Achieve Goals: Good    Frequency Min 3X/week   Barriers to discharge        Co-evaluation               AM-PAC PT "6 Clicks" Mobility  Outcome Measure Help needed turning from your back to your side while in a flat bed without using bedrails?: A Little Help needed moving from lying on your back to sitting on the side of a flat bed without using bedrails?: A Little Help needed moving to and from a bed to a chair (including a wheelchair)?: A Little Help needed standing up from  a chair using your arms (e.g., wheelchair or bedside chair)?: A Little Help needed to walk in hospital room?: A Lot Help needed climbing 3-5 steps with a railing? : A Lot 6 Click Score: 16    End of Session Equipment Utilized During Treatment: Oxygen;Other (comment) (L knee bledsoe brace locked in extension) Activity Tolerance: Patient limited by pain Patient left: in bed;with call bell/phone within reach;with bed alarm set Nurse Communication: Mobility status PT Visit Diagnosis: Other abnormalities of gait and mobility (R26.89)    Time: 5277-8242 PT Time Calculation (min) (ACUTE ONLY): 19 min   Charges:   PT Evaluation $PT Eval Low Complexity: 1 Low        Reesha Debes S, PT DPT Acute Rehabilitation Services Pager [phone]  Office [phone]   Izacc Demeyer E Christain [place] 10/17/2021, 10:52 AM

## 2021-10-17 NOTE — Progress Notes (Signed)
Subjective:  Lori Ayers is a 55 y.o. female, 1 Day Post-Op   s/p Procedure(s): OPEN REDUCTION INTERNAL (ORIF) FIXATION PATELLA   Patient reports pain as moderate to severe.  Patient reports moderate to severe pain.  Does have some concerns with the pain medication as she typically takes 20 mg of oxycodone 5 times a day at baseline.  She was able to do a little bit of walking to the bathroom with physical therapy but did have some difficulties with this.  She is been waiting to go to CT scan for a cardiology scan and has been n.p.o. so she is been frustrated as this was supposed to happen earlier this morning and is hungry.  Reports some decrease sensation in the foot.  Denies fever or chills.  Objective:   VITALS:   Vitals:   10/16/21 2219 10/17/21 0200 10/17/21 0416 10/17/21 0759  BP: (!) 128/99 116/76 (!) 142/96 (!) 151/95  Pulse: 77 81 84 77  Resp: 19 18 18 17   Temp: 98 F (36.7 C) 98.1 F (36.7 C) 98.1 F (36.7 C) 97.9 F (36.6 C)  TempSrc: Oral Oral Oral Oral  SpO2: 93% 94% 97% 92%  Weight:      Height:       Constitutional: General Appearance: healthy-appearing, well-nourished, and well-developed.  Laying in hospital bed Level of Distress: no acute distress. Eyes: Lens (normal) clear: both eyes. Head: Head: normocephalic and atraumatic. Lungs: Respiratory effort: no dyspnea. Skin: Inspection and palpation: no rash, lesions Neurologic: Cranial Nerves: grossly intact. Sensation: grossly intact. Psychiatric: Insight: good judgement and insight. Mental Status: normal mood and affect and active and alert. Orientation: to time, place, and person.  Left lower extremity:  Ace wrap clean dry and intact.  No significant drainage or bleeding on the Aquacel.  Able to wiggle toes and move ankle.  Capillary refill intact.  DP pulse 1+.  No calf pain.   Neurologically intact Neurovascular intact Sensation intact distally Intact pulses distally Dorsiflexion/Plantar flexion  intact Incision: dressing C/D/I Compartment soft   Lab Results  Component Value Date   WBC 5.3 10/16/2021   HGB 13.1 10/16/2021   HCT 37.1 10/16/2021   MCV 112.1 (H) 10/16/2021   PLT 89 (L) 10/16/2021   BMET    Component Value Date/Time   NA 135 10/16/2021 1305   K 3.7 10/16/2021 1305   CL 97 (L) 10/16/2021 1305   CO2 27 10/16/2021 1305   GLUCOSE 114 (H) 10/16/2021 1305   BUN 5 (L) 10/16/2021 1305   CREATININE 0.69 10/16/2021 1829   CALCIUM 9.0 10/16/2021 1305   GFRNONAA >60 10/16/2021 1829     Assessment/Plan: 1 Day Post-Op   Active Problems:   Left patella fracture   Advance diet Up with therapy  likely discharge tomorrow assuming no urgent findings on her cardiology CT scan. 3 and 1 for home use ordered, home health PT ordered. Weightbearing Status: Weightbearing as tolerated while locked in extension with Bledsoe brace DVT Prophylaxis: Lovenox while inpatient, aspirin outpatient.   Pain control: Discussed with patient that I adjusted her pain medications to her home dose of  20 mg of oxycodone every 5 hours PRN, this can be done scheduled as this is her normal home dose.  She can take an extra 5 to 10 mg oxycodone every 4-6 hours as needed for breakthrough pain.  Patient reports Dilaudid does not seem to be super helpful, I would avoid giving this as needed unless significant issues with pain.  Toradol ordered for 4 more doses to assist with pain.   Arbie Cookey 10/17/2021, 1:33 PM  Dion Saucier PA-C  Physician Assistant with Dr. Rebekah [place]

## 2021-10-18 DIAGNOSIS — J449 Chronic obstructive pulmonary disease, unspecified: Secondary | ICD-10-CM | POA: Diagnosis present

## 2021-10-18 DIAGNOSIS — Z79891 Long term (current) use of opiate analgesic: Secondary | ICD-10-CM | POA: Diagnosis not present

## 2021-10-18 DIAGNOSIS — Z8249 Family history of ischemic heart disease and other diseases of the circulatory system: Secondary | ICD-10-CM | POA: Diagnosis not present

## 2021-10-18 DIAGNOSIS — W010XXA Fall on same level from slipping, tripping and stumbling without subsequent striking against object, initial encounter: Secondary | ICD-10-CM | POA: Diagnosis present

## 2021-10-18 DIAGNOSIS — S82032A Displaced transverse fracture of left patella, initial encounter for closed fracture: Secondary | ICD-10-CM | POA: Diagnosis not present

## 2021-10-18 DIAGNOSIS — I10 Essential (primary) hypertension: Secondary | ICD-10-CM | POA: Diagnosis present

## 2021-10-18 DIAGNOSIS — F40240 Claustrophobia: Secondary | ICD-10-CM | POA: Diagnosis present

## 2021-10-18 DIAGNOSIS — I2510 Atherosclerotic heart disease of native coronary artery without angina pectoris: Secondary | ICD-10-CM | POA: Diagnosis present

## 2021-10-18 DIAGNOSIS — M419 Scoliosis, unspecified: Secondary | ICD-10-CM | POA: Diagnosis present

## 2021-10-18 DIAGNOSIS — Z7951 Long term (current) use of inhaled steroids: Secondary | ICD-10-CM | POA: Diagnosis not present

## 2021-10-18 DIAGNOSIS — I252 Old myocardial infarction: Secondary | ICD-10-CM | POA: Diagnosis not present

## 2021-10-18 DIAGNOSIS — Z79899 Other long term (current) drug therapy: Secondary | ICD-10-CM | POA: Diagnosis not present

## 2021-10-18 DIAGNOSIS — R9431 Abnormal electrocardiogram [ECG] [EKG]: Secondary | ICD-10-CM | POA: Diagnosis not present

## 2021-10-18 DIAGNOSIS — Z96641 Presence of right artificial hip joint: Secondary | ICD-10-CM | POA: Diagnosis present

## 2021-10-18 DIAGNOSIS — Z716 Tobacco abuse counseling: Secondary | ICD-10-CM | POA: Diagnosis not present

## 2021-10-18 DIAGNOSIS — G894 Chronic pain syndrome: Secondary | ICD-10-CM | POA: Diagnosis present

## 2021-10-18 DIAGNOSIS — M1990 Unspecified osteoarthritis, unspecified site: Secondary | ICD-10-CM | POA: Diagnosis present

## 2021-10-18 DIAGNOSIS — Z888 Allergy status to other drugs, medicaments and biological substances status: Secondary | ICD-10-CM | POA: Diagnosis not present

## 2021-10-18 DIAGNOSIS — G4733 Obstructive sleep apnea (adult) (pediatric): Secondary | ICD-10-CM | POA: Diagnosis present

## 2021-10-18 DIAGNOSIS — R072 Precordial pain: Secondary | ICD-10-CM | POA: Diagnosis not present

## 2021-10-18 DIAGNOSIS — K219 Gastro-esophageal reflux disease without esophagitis: Secondary | ICD-10-CM | POA: Diagnosis present

## 2021-10-18 DIAGNOSIS — F17210 Nicotine dependence, cigarettes, uncomplicated: Secondary | ICD-10-CM | POA: Diagnosis present

## 2021-10-18 MED ORDER — ASPIRIN EC 81 MG PO TBEC: 81.0000 mg | DELAYED_RELEASE_TABLET | Freq: Two times a day (BID) | ORAL | 0 refills | Status: AC

## 2021-10-18 NOTE — Progress Notes (Signed)
Attemped to call On-Call Nelia Shi, Ortho PA x2 for discharge instructions. No call back at this time.

## 2021-10-18 NOTE — Progress Notes (Signed)
Pt was given discharge paperwork at 1430. Pt was asked to wait for bedside commode 3in1 to be delieved to her room. Patient left at 1510 without bedside commode. Charge Nurse Mitzi Davenport RN aware.

## 2021-10-18 NOTE — Discharge Summary (Signed)
Patient ID: Lori Ayers MRN: [id number] DOB/AGE: 53-Sep-1967 53 y.o.  Admit date: 10/16/2021 Discharge date: 10/18/2021  Primary Diagnosis: Left patella fracture Admission Diagnoses: S/p ORIF of left patella fracture Past Medical History:  Diagnosis Date   Arthritis    Chronic back pain    COPD (chronic obstructive pulmonary disease) (HCC)    CPAP (continuous positive airway pressure) dependence    GERD (gastroesophageal reflux disease)    Hypertension    Scoliosis    Sleep apnea    Discharge Diagnoses:   Active Problems:   Left patella fracture  Estimated body mass index is 23.78 kg/m as calculated from the following:   Height as of this encounter: 5\' 2"  (1.575 m).   Weight as of this encounter: 59 kg.  Procedure:  Procedure(s) (LRB): OPEN REDUCTION INTERNAL (ORIF) FIXATION PATELLA (Left)   Consults: cardiology  HPI: Lori Ayers is a 55 year old female who presented to the emergency room on 10/12/2021 after a ground-level fall resulting in a left patella fracture.  This was displaced and required surgery.  She presented to the hospital on 10/16/2021 for open reduction internal fixation of the left patella.  This was performed and patient was admitted for pain control and physical therapy.  There were concerns with EKG changes around the time of surgery and cardiology was consulted once patient was brought up to the floor.  Patient does have have history of chronic pain syndrome on chronic opioid medication by a pain management provider. Laboratory Data: Admission on 10/16/2021  Component Date Value Ref Range Status   Sodium 10/16/2021 135  135 - 145 mmol/L Final   Potassium 10/16/2021 3.7  3.5 - 5.1 mmol/L Final   Chloride 10/16/2021 97 (A)  98 - 111 mmol/L Final   CO2 10/16/2021 27  22 - 32 mmol/L Final   Glucose, Bld 10/16/2021 114 (A)  70 - 99 mg/dL Final   Glucose reference range applies only to samples taken after fasting for at least 8 hours.   BUN 10/16/2021 5 (A)   6 - 20 mg/dL Final   Creatinine, Ser 10/16/2021 0.60  0.44 - 1.00 mg/dL Final   Calcium 13/08/2021 9.0  8.9 - 10.3 mg/dL Final   GFR, Estimated 10/16/2021 >60  >60 mL/min Final   Comment: (NOTE) Calculated using the CKD-EPI Creatinine Equation (2021)    Anion gap 10/16/2021 11  5 - 15 Final   Performed at [facility], [address], [place], [place]   WBC 10/16/2021 6.8  4.0 - 10.5 K/uL Final   RBC 10/16/2021 3.89  3.87 - 5.11 MIL/uL Final   Hemoglobin 10/16/2021 15.3 (A)  12.0 - 15.0 g/dL Final   HCT 13/08/2021 43.7  36.0 - 46.0 % Final   MCV 10/16/2021 112.3 (A)  80.0 - 100.0 fL Final   MCH 10/16/2021 39.3 (A)  26.0 - 34.0 pg Final   MCHC 10/16/2021 35.0  30.0 - 36.0 g/dL Final   RDW 13/08/2021 14.5  11.5 - 15.5 % Final   Platelets 10/16/2021 PLATELET CLUMPS NOTED ON SMEAR, UNABLE TO ESTIMATE  150 - 400 K/uL Final   Comment: Immature Platelet Fraction may be clinically indicated, consider ordering this additional test 13/08/2021    nRBC 10/16/2021 0.0  0.0 - 0.2 % Final   Performed at [facility], [address], [place], [place]   WBC 10/16/2021 5.3  4.0 - 10.5 K/uL Final   RBC 10/16/2021 3.31 (A)  3.87 - 5.11  MIL/uL Final   Hemoglobin 10/16/2021 13.1  12.0 - 15.0 g/dL Final   HCT 02/54/2706 37.1  36.0 - 46.0 % Final   MCV 10/16/2021 112.1 (A)  80.0 - 100.0 fL Final   MCH 10/16/2021 39.6 (A)  26.0 - 34.0 pg Final   MCHC 10/16/2021 35.3  30.0 - 36.0 g/dL Final   RDW 23/76/2831 14.5  11.5 - 15.5 % Final   Platelets 10/16/2021 89 (A)  150 - 400 K/uL Final   Comment: SPECIMEN CHECKED FOR CLOTS Immature Platelet Fraction may be clinically indicated, consider ordering this additional test [id number] REPEATED TO VERIFY PLATELET COUNT CONFIRMED BY SMEAR    nRBC 10/16/2021 0.0  0.0 - 0.2 % Final   Performed at [facility], [address], [place], [place] [address]   Creatinine, Ser 10/16/2021 0.69  0.44 - 1.00 mg/dL Final   GFR, Estimated  10/16/2021 >60  >60 mL/min Final   Comment: (NOTE) Calculated using the CKD-EPI Creatinine Equation (2021) Performed at [facility], [address] [place], [place] [address]    Troponin I (High Sensitivity) 10/16/2021 3  <18 ng/L Final   Comment: (NOTE) Elevated high sensitivity troponin I (hsTnI) values and significant  changes across serial measurements may suggest ACS but many other  chronic and acute conditions are known to elevate hsTnI results.  Refer to the "Links" section for chest pain algorithms and additional  guidance. Performed at [facility], [address], [place], [place] [address]    Troponin I (High Sensitivity) 10/16/2021 3  <18 ng/L Final   Comment: (NOTE) Elevated high sensitivity troponin I (hsTnI) values and significant  changes across serial measurements may suggest ACS but many other  chronic and acute conditions are known to elevate hsTnI results.  Refer to the "Links" section for chest pain algorithms and additional  guidance. Performed at [facility], [address] [place], [place] [address]      X-Rays:DG Knee 1-2 Views Right  Result Date: 10/16/2021 CLINICAL DATA:  Intraoperative open reduction internal fixation patellar fracture EXAM: RIGHT KNEE - 1-2 VIEW COMPARISON:  None. FINDINGS: Intraoperative fluoroscopic images of left patellar fracture fixation with 2 screws. Fluoroscopic time was 21 seconds. Total fluoroscopic dose was 1.09 mGy. IMPRESSION: Intraoperative use of fluoroscopy for fixation of patellar fracture. Electronically Signed   By: Larose Hires D.O.   On: 10/16/2021 16:20   CT CORONARY MORPH W/CTA COR W/SCORE W/CA W/CM &/OR WO/CM  Addendum Date: 10/17/2021   ADDENDUM REPORT: 10/17/2021 15:55 CLINICAL DATA:  43F with chest pain EXAM: Cardiac/Coronary CTA TECHNIQUE: The patient was scanned on a [employer]. FINDINGS: A 100 kV prospective scan was triggered in the descending thoracic aorta at 111 HU's.  Axial non-contrast 3 mm slices were carried out through the heart. The data set was analyzed on a dedicated work station and scored using the Agatson method. Gantry rotation speed was 250 msecs and collimation was .6 mm. 0.8 mg of sl NTG was given. The 3D data set was reconstructed in 5% intervals of the 35-75% of the R-R cycle. Phases were analyzed on a dedicated work station using MPR, MIP and VRT modes. The patient received 80 cc of contrast. Coronary Arteries:  Normal coronary origin.  Right dominance. RCA is a large dominant artery that gives rise to PDA and PLA. Mixed plaque in proximal to mid RCA causes ~50% stenosis. Mixed plaque in distal RCA causes 25-49% stenosis Left main is a large  artery that gives rise to LAD and LCX arteries. Mixed plaque in ostial left main causes 25-49% stenosis. LAD is a large vessel that has no plaque. Calcified plaque in proximal LAD causes 0 -24% stenosis. Mixed plaque in D1 causes 25-49% stenosis LCX is a non-dominant artery. Noncalcified plaque in proximal LCX causes 25-49% stenosis. Calcified plaque in mid LCX causes 0-24% stenosis Mixed plaque in proximal ramus causes 0-24% stenosis Other findings: Left Ventricle: Normal size Left Atrium: Normal size Pulmonary Veins: Normal configuration Right Ventricle: Normal size Right Atrium: Normal size Cardiac valves: No calcifications Thoracic aorta: Normal size Pulmonary Arteries: Normal size Systemic Veins: Normal drainage Pericardium: Normal thickness IMPRESSION: 1. Coronary calcium score of 386. This was 99th percentile for age and sex matched control. 2. Normal coronary origin with right dominance. 3. Mixed plaque in proximal to mid RCA causes mild to moderate (~50%) stenosis. Mixed plaque in distal RCA causes mild (25-49%) stenosis 4. Mixed plaque in ostial left main causes mild (25-49%) stenosis. 5. Calcified plaque in proximal LAD causes minimal (0-24%) stenosis. Mixed plaque in D1 causes mild (25-49%) stenosis 6.  Noncalcified plaque in proximal LCX causes mild (25-49%) stenosis. Calcified plaque in mid LCX causes minimal (0-24%) stenosis 7. Mixed plaque in proximal ramus causes minimal (0-24%) stenosis 8. Will send for CT FFR CAD-RADS 3. Moderate stenosis. Consider symptom-guided anti-ischemic pharmacotherapy as well as risk factor modification per guideline directed care. Additional analysis with CT FFR will be submitted. Electronically Signed   By: Epifanio Lesches M.D.   On: 10/17/2021 15:55   Result Date: 10/17/2021 EXAM: OVER-READ INTERPRETATION  CT CHEST The following report is an over-read performed by radiologist Dr. Richarda Overlie of [facility] on 10/17/2021. This over-read does not include interpretation of cardiac or coronary anatomy or pathology. The coronary calcium score/coronary CTA interpretation by the cardiologist is attached. COMPARISON:  CT abdomen 08/16/2018 FINDINGS: Vascular: Atherosclerotic disease involving the descending thoracic aorta without evidence for a dissection. Visualized pulmonary arteries are patent. Mediastinum/Nodes: Visualized mediastinal structures are normal. Lzungs/Pleura: Centrilobular emphysema. Patchy densities in the dependent lower lobes, right side greater than left. No pleural effusions. Upper Abdomen: Decreased attenuation in the liver is compatible with steatosis. Musculoskeletal: Multiple left rib fractures with callus formation. IMPRESSION: 1. Dependent densities in lower lungs, right side greater than left. Favor atelectasis. No pleural effusions. 2. Hepatic steatosis. 3. Aortic Atherosclerosis (ICD10-I70.0) and Emphysema (ICD10-J43.9). Electronically Signed: By: Richarda Overlie M.D. On: 10/17/2021 14:21   DG Knee Complete 4 Views Left  Result Date: 10/14/2021 CLINICAL DATA:  Knee pain with limited weight-bearing after slipping and falling last night. EXAM: LEFT KNEE - COMPLETE 4+ VIEW COMPARISON:  Limited correlation made with CT of the left thigh  10/28/2013. FINDINGS: There is an acute, mildly comminuted nearly transverse fracture through the patella. This is associated with up to 9 mm of distraction of the fracture fragments. The patella is located. There is posttraumatic deformity of the proximal fibular diaphysis consistent with an old healed fracture. No other acute fracture or dislocation identified. There is a large lipohemarthrosis. IMPRESSION: Comminuted and mildly displaced fracture of the patella with associated lipohemarthrosis. Electronically Signed   By: Carey Bullocks M.D.   On: 10/14/2021 14:50   CT CORONARY FRACTIONAL FLOW RESERVE DATA PREP  Result Date: 10/17/2021 EXAM: FFRCT ANALYSIS FINDINGS: FFRct analysis was performed on the original cardiac CT angiogram dataset. Diagrammatic representation of the FFRct analysis is provided in a separate PDF document in PACS. This dictation was created using  the PDF document and an interactive 3D model of the results. 3D model is not available in the EMR/PACS. Normal FFR range is >0.80. 1. Left Main: No significant stenosis 2. LAD:No significant stenosis 3. LCX: No significant stenosis 4. Ramus: No significant stenosis 5. RCA: CTFFR 0.86 across lesion in mid RCA and 0.83 across lesion in distal RCA, suggesting no functional significance IMPRESSION: 1.  No functionally significant coronary stenosis by CT FFR Electronically Signed   By: Epifanio Lesches M.D.   On: 10/17/2021 17:11   DG C-Arm 1-60 Min-No Report  Result Date: 10/16/2021 Fluoroscopy was utilized by the requesting physician.  No radiographic interpretation.    EKG: Orders placed or performed during the hospital encounter of 10/16/21   EKG 12 lead per protocol   EKG 12 lead per protocol     Hospital Course: AMIELLE PATIENT is a 55 y.o. who was admitted to Hospital. They were brought to the operating room on 10/16/2021 and underwent Procedure(s): OPEN REDUCTION INTERNAL (ORIF) FIXATION PATELLA.  Patient tolerated the  procedure well and was later transferred to the recovery room and then to the orthopaedic floor for postoperative care.  They were given PO and IV analgesics for pain control following their surgery.  They were given 24 hours of postoperative antibiotics of  Anti-infectives (From admission, onward)    Start     Dose/Rate Route Frequency Ordered Stop   10/16/21 2100  ceFAZolin (ANCEF) IVPB 1 g/50 mL premix        1 g 100 mL/hr over 30 Minutes Intravenous Every 6 hours 10/16/21 1824 10/17/21 1459   10/16/21 1315  ceFAZolin (ANCEF) IVPB 2g/100 mL premix        2 g 200 mL/hr over 30 Minutes Intravenous On call to O.R. 10/16/21 1257 10/16/21 1510      and started on DVT prophylaxis in the form of Lovenox.   PT and OT were ordered assistance with safe ambulation .discharge planning consulted to help with postop disposition and equipment needs.  Patient had a uneventful night on the evening of surgery though did have complaints of pain..  They started to get up OOB with therapy on day one. Continued to work with therapy into day two.  Cardiology consult on the patient and a coronary CT scan was ordered and they recommended cholesterol medication and outpatient follow-up with her PCP.  Dressing is clean dry and intact.  Patient was seen in rounds and was ready to go home.   Diet: Regular diet Activity:WBAT while locked in extension Follow-up:in 2 weeks Disposition - Home Discharged Condition: good    Allergies as of 10/18/2021       Reactions   Gabapentin Other (See Comments)   Leg and arm jerking at bedtime         Medication List     TAKE these medications    albuterol 108 (90 Base) MCG/ACT inhaler Commonly known as: VENTOLIN HFA Inhale 2 puffs into the lungs every 6 (six) hours as needed for wheezing or shortness of breath.   amitriptyline 50 MG tablet Commonly known as: ELAVIL Take 50-100 mg by mouth at bedtime.   aspirin EC 81 MG tablet Take 1 tablet (81 mg total) by mouth  in the morning and at bedtime. Swallow whole.   busPIRone 15 MG tablet Commonly known as: BUSPAR Take 15 mg by mouth 3 (three) times daily.   fluticasone furoate-vilanterol 100-25 MCG/ACT Aepb Commonly known as: BREO ELLIPTA Inhale 1 puff into the lungs  daily as needed (asthma).   HYDROmorphone 2 MG tablet Commonly known as: Dilaudid Take 1 tablet (2 mg total) by mouth every 4 (four) hours as needed for up to 5 days for severe pain.   lisinopril 20 MG tablet Commonly known as: ZESTRIL Take 20 mg by mouth daily.   methocarbamol 500 MG tablet Commonly known as: Robaxin Take 1 tablet (500 mg total) by mouth every 6 (six) hours as needed for muscle spasms.   metoprolol succinate 25 MG 24 hr tablet Commonly known as: TOPROL-XL Take 25 mg by mouth daily.   omeprazole 40 MG capsule Commonly known as: PRILOSEC Take 40 mg by mouth daily.   Oxycodone HCl 20 MG Tabs Take 1 tablet by mouth 5 (five) times daily as needed.   Vitamin D (Ergocalciferol) 1.25 MG (50000 UNIT) Caps capsule Commonly known as: DRISDOL Take 50,000 Units by mouth every Thursday.               Durable Medical Equipment  (From admission, onward)           Start     Ordered   10/17/21 1331  For home use only DME 3 n 1  Once        10/17/21 1330            Follow-up Information     Yolonda Kida, MD Follow up in 2 week(s).   Specialty: Orthopedic Surgery Why: For suture removal, For wound re-check Contact information: [address] [place] [address] [place] [address] [phone]                 Signed: Dion Saucier PA-C  Orthopaedic Surgery 10/18/2021, 12:30 PM

## 2021-10-18 NOTE — TOC Initial Note (Addendum)
Transition of Care [facility]) - Initial/Assessment Note    Patient Details  Name: Lori Ayers MRN: [id number] Date of Birth: 02/25/1966  Transition of [facility]) CM/SW Contact:    Gala Lewandowsky, RN Phone Number: 10/18/2021, 2:02 PM  Clinical Narrative:   Plan for patient to transition home today. Case Manager spoke with the provider to clarify orders -HH can start as soon as possible. Case Manager spoke with the patient and she is agreeable to home health services. Patient did not have a preference; however, wanted it to be in network. Case Manager called [facility] and the agency will service the patient. Start of care to begin on Tuesday, MD and staff RN both are aware. Durable medical equipment bedside commode is in the room. No further needs from Case Manager at this time.   1421 10-18-21 Case Manager spoke with Physical Therapy-and the patient will need a bedside commode for home. Case Manager spoke with [employer] and the DME has been ordered. DME bedside commode will be delivered to the room shortly. Patient and staff RN is aware.   Expected Discharge Plan: Home w Home Health Services Barriers to Discharge: No Barriers Identified   Patient Goals and CMS Choice Patient states their goals for this hospitalization and ongoing recovery are:: to return home   Choice offered to / list presented to :  (Patient did not have a preference.)  Expected Discharge Plan and Services Expected Discharge Plan: Home w Home Health Services In-house Referral: Clinical Social Work Discharge Planning Services: CM Consult Post Acute Care Choice: Home Health Living arrangements for the past 2 months: Single Family Home Expected Discharge Date: 10/18/21                         HH Arranged: PT HH Agency: [facility] Date [facility] Agency Contacted: 10/18/21 Time HH Agency Contacted: 1300 Representative spoke with at [facility] Agency: Kandee Keen  Prior Living Arrangements/Services Living arrangements  for the past 2 months: Single Family Home Lives with:: Spouse Patient language and need for interpreter reviewed:: Yes        Need for Family Participation in Patient Care: Yes (Comment) Care giver support system in place?: Yes (comment) Current home services: DME Criminal Activity/Legal Involvement Pertinent to Current Situation/Hospitalization: No - Comment as needed  Activities of Daily Living Home Assistive Devices/Equipment: Wheelchair, Oxygen, Scales, Dentures (specify type) ADL Screening (condition at time of admission) Patient's cognitive ability adequate to safely complete daily activities?: Yes Is the patient deaf or have difficulty hearing?: No Does the patient have difficulty seeing, even when wearing glasses/contacts?: No Does the patient have difficulty concentrating, remembering, or making decisions?: No Patient able to express need for assistance with ADLs?: Yes Does the patient have difficulty dressing or bathing?: No Independently performs ADLs?: Yes (appropriate for developmental age) Does the patient have difficulty walking or climbing stairs?: Yes Weakness of Legs: Left Weakness of Arms/Hands: None  Permission Sought/Granted Permission sought to share information with : Case Manager, Magazine features editor, Family Supports Permission granted to share information with : Yes, Verbal Permission Granted     Permission granted to share info w AGENCY: [facility]        Emotional Assessment Appearance:: Appears stated age Attitude/Demeanor/Rapport: Engaged Affect (typically observed): Appropriate Orientation: : Oriented to  Time, Oriented to Self, Oriented to Situation, Oriented to Place Alcohol / Substance Use: Not Applicable Psych Involvement: No (comment)  Admission diagnosis:  Left patella fracture [S82.002A] Patient  Active Problem List   Diagnosis Date Noted   Left patella fracture 10/16/2021   Sleep apnea    CPAP (continuous positive airway  pressure) dependence    Scoliosis    PCP:  Treasa School, PA-C Pharmacy:   [phone] [place], [place] - [address] [place] [address] Phone: [phone] Fax: [phone]   Readmission Risk Interventions No flowsheet data found.

## 2021-10-18 NOTE — Progress Notes (Signed)
PT Cancellation Note  Patient Details Name: Lori Ayers MRN: [id number] DOB: Apr 19, 1966   Cancelled Treatment:    Reason Eval/Treat Not Completed: (P)  (Pt declined need for stair training this pm despite education.)   Florestine Avers 10/18/2021, 2:22 PM  Bonney Leitz , PTA Acute Rehabilitation Services Pager [phone] Office [phone]

## 2021-10-18 NOTE — Progress Notes (Addendum)
Progress Note  Patient Name: Lori Ayers Date of Encounter: 10/18/2021  Primary Cardiologist:   None   Subjective   No chest pain.  No SOB.   Inpatient Medications    Scheduled Meds:  amitriptyline  50-100 mg Oral QHS   busPIRone  15 mg Oral TID   docusate sodium  100 mg Oral BID   enoxaparin (LOVENOX) injection  40 mg Subcutaneous Q24H   lisinopril  20 mg Oral Daily   metoprolol succinate  25 mg Oral Daily   pantoprazole  40 mg Oral Daily   traMADol  50 mg Oral Q6H   Vitamin D (Ergocalciferol)  50,000 Units Oral Q Thu   Continuous Infusions:  methocarbamol (ROBAXIN) IV     PRN Meds: acetaminophen, albuterol, fluticasone furoate-vilanterol, HYDROmorphone (DILAUDID) injection, methocarbamol **OR** methocarbamol (ROBAXIN) IV, metoCLOPramide **OR** metoCLOPramide (REGLAN) injection, ondansetron **OR** ondansetron (ZOFRAN) IV, oxyCODONE, oxyCODONE   Vital Signs    Vitals:   10/17/21 0759 10/17/21 1558 10/17/21 2112 10/18/21 0421  BP: (!) 151/95 125/76 140/80   Pulse: 77 66 72 74  Resp: 17 16 18 18   Temp: 97.9 F (36.6 C) 98.2 F (36.8 C) 98.4 F (36.9 C) 98.3 F (36.8 C)  TempSrc: Oral Oral Oral Oral  SpO2: 92% 90% 93% 90%  Weight:      Height:        Intake/Output Summary (Last 24 hours) at 10/18/2021 0844 Last data filed at 10/18/2021 0400 Gross per 24 hour  Intake 460 ml  Output 0 ml  Net 460 ml   Filed Weights   10/16/21 1317  Weight: 59 kg    Telemetry    NA - Personally Reviewed  ECG    NA - Personally Reviewed  Physical Exam   GEN: No acute distress.   Neck: No  JVD Cardiac: RRR, no murmurs, rubs, or gallops.  Respiratory: Clear  to auscultation bilaterally. GI: Soft, nontender, non-distended  MS: No  edema; No deformity. Neuro:  Nonfocal  Psych: Normal affect   Labs    Chemistry Recent Labs  Lab 10/16/21 1305 10/16/21 1829  NA 135  --   K 3.7  --   CL 97*  --   CO2 27  --   GLUCOSE 114*  --   BUN 5*  --    CREATININE 0.60 0.69  CALCIUM 9.0  --   GFRNONAA >60 >60  ANIONGAP 11  --      Hematology Recent Labs  Lab 10/16/21 1305 10/16/21 1829  WBC 6.8 5.3  RBC 3.89 3.31*  HGB 15.3* 13.1  HCT 43.7 37.1  MCV 112.3* 112.1*  MCH 39.3* 39.6*  MCHC 35.0 35.3  RDW 14.5 14.5  PLT PLATELET CLUMPS NOTED ON SMEAR, UNABLE TO ESTIMATE 89*    Cardiac EnzymesNo results for input(s): TROPONINI in the last 168 hours. No results for input(s): TROPIPOC in the last 168 hours.   BNPNo results for input(s): BNP, PROBNP in the last 168 hours.   DDimer No results for input(s): DDIMER in the last 168 hours.   Radiology    DG Knee 1-2 Views Right  Result Date: 10/16/2021 CLINICAL DATA:  Intraoperative open reduction internal fixation patellar fracture EXAM: RIGHT KNEE - 1-2 VIEW COMPARISON:  None. FINDINGS: Intraoperative fluoroscopic images of left patellar fracture fixation with 2 screws. Fluoroscopic time was 21 seconds. Total fluoroscopic dose was 1.09 mGy. IMPRESSION: Intraoperative use of fluoroscopy for fixation of patellar fracture. Electronically Signed   By: 13/08/2021.O.  On: 10/16/2021 16:20   CT CORONARY MORPH W/CTA COR W/SCORE W/CA W/CM &/OR WO/CM  Addendum Date: 10/17/2021   ADDENDUM REPORT: 10/17/2021 15:55 CLINICAL DATA:  71F with chest pain EXAM: Cardiac/Coronary CTA TECHNIQUE: The patient was scanned on a [employer]. FINDINGS: A 100 kV prospective scan was triggered in the descending thoracic aorta at 111 HU's. Axial non-contrast 3 mm slices were carried out through the heart. The data set was analyzed on a dedicated work station and scored using the Agatson method. Gantry rotation speed was 250 msecs and collimation was .6 mm. 0.8 mg of sl NTG was given. The 3D data set was reconstructed in 5% intervals of the 35-75% of the R-R cycle. Phases were analyzed on a dedicated work station using MPR, MIP and VRT modes. The patient received 80 cc of contrast. Coronary Arteries:   Normal coronary origin.  Right dominance. RCA is a large dominant artery that gives rise to PDA and PLA. Mixed plaque in proximal to mid RCA causes ~50% stenosis. Mixed plaque in distal RCA causes 25-49% stenosis Left main is a large artery that gives rise to LAD and LCX arteries. Mixed plaque in ostial left main causes 25-49% stenosis. LAD is a large vessel that has no plaque. Calcified plaque in proximal LAD causes 0 -24% stenosis. Mixed plaque in D1 causes 25-49% stenosis LCX is a non-dominant artery. Noncalcified plaque in proximal LCX causes 25-49% stenosis. Calcified plaque in mid LCX causes 0-24% stenosis Mixed plaque in proximal ramus causes 0-24% stenosis Other findings: Left Ventricle: Normal size Left Atrium: Normal size Pulmonary Veins: Normal configuration Right Ventricle: Normal size Right Atrium: Normal size Cardiac valves: No calcifications Thoracic aorta: Normal size Pulmonary Arteries: Normal size Systemic Veins: Normal drainage Pericardium: Normal thickness IMPRESSION: 1. Coronary calcium score of 386. This was 99th percentile for age and sex matched control. 2. Normal coronary origin with right dominance. 3. Mixed plaque in proximal to mid RCA causes mild to moderate (~50%) stenosis. Mixed plaque in distal RCA causes mild (25-49%) stenosis 4. Mixed plaque in ostial left main causes mild (25-49%) stenosis. 5. Calcified plaque in proximal LAD causes minimal (0-24%) stenosis. Mixed plaque in D1 causes mild (25-49%) stenosis 6. Noncalcified plaque in proximal LCX causes mild (25-49%) stenosis. Calcified plaque in mid LCX causes minimal (0-24%) stenosis 7. Mixed plaque in proximal ramus causes minimal (0-24%) stenosis 8. Will send for CT FFR CAD-RADS 3. Moderate stenosis. Consider symptom-guided anti-ischemic pharmacotherapy as well as risk factor modification per guideline directed care. Additional analysis with CT FFR will be submitted. Electronically Signed   By: Epifanio Lesches M.D.   On:  10/17/2021 15:55   Result Date: 10/17/2021 EXAM: OVER-READ INTERPRETATION  CT CHEST The following report is an over-read performed by radiologist Dr. Richarda Overlie of [facility] on 10/17/2021. This over-read does not include interpretation of cardiac or coronary anatomy or pathology. The coronary calcium score/coronary CTA interpretation by the cardiologist is attached. COMPARISON:  CT abdomen 08/16/2018 FINDINGS: Vascular: Atherosclerotic disease involving the descending thoracic aorta without evidence for a dissection. Visualized pulmonary arteries are patent. Mediastinum/Nodes: Visualized mediastinal structures are normal. Lzungs/Pleura: Centrilobular emphysema. Patchy densities in the dependent lower lobes, right side greater than left. No pleural effusions. Upper Abdomen: Decreased attenuation in the liver is compatible with steatosis. Musculoskeletal: Multiple left rib fractures with callus formation. IMPRESSION: 1. Dependent densities in lower lungs, right side greater than left. Favor atelectasis. No pleural effusions. 2. Hepatic steatosis. 3. Aortic Atherosclerosis (ICD10-I70.0) and Emphysema (  ICD10-J43.9). Electronically Signed: By: Richarda Overlie M.D. On: 10/17/2021 14:21   CT CORONARY FRACTIONAL FLOW RESERVE DATA PREP  Result Date: 10/17/2021 EXAM: FFRCT ANALYSIS FINDINGS: FFRct analysis was performed on the original cardiac CT angiogram dataset. Diagrammatic representation of the FFRct analysis is provided in a separate PDF document in PACS. This dictation was created using the PDF document and an interactive 3D model of the results. 3D model is not available in the EMR/PACS. Normal FFR range is >0.80. 1. Left Main: No significant stenosis 2. LAD:No significant stenosis 3. LCX: No significant stenosis 4. Ramus: No significant stenosis 5. RCA: CTFFR 0.86 across lesion in mid RCA and 0.83 across lesion in distal RCA, suggesting no functional significance IMPRESSION: 1.  No functionally  significant coronary stenosis by CT FFR Electronically Signed   By: Epifanio Lesches M.D.   On: 10/17/2021 17:11   DG C-Arm 1-60 Min-No Report  Result Date: 10/16/2021 Fluoroscopy was utilized by the requesting physician.  No radiographic interpretation.    Cardiac Studies   CT  Coronary Arteries:  Normal coronary origin.  Right dominance.   RCA is a large dominant artery that gives rise to PDA and PLA. Mixed plaque in proximal to mid RCA causes ~50% stenosis. Mixed plaque in distal RCA causes 25-49% stenosis   Left main is a large artery that gives rise to LAD and LCX arteries. Mixed plaque in ostial left main causes 25-49% stenosis.   LAD is a large vessel that has no plaque. Calcified plaque in proximal LAD causes 0 -24% stenosis. Mixed plaque in D1 causes 25-49% stenosis   LCX is a non-dominant artery. Noncalcified plaque in proximal LCX causes 25-49% stenosis. Calcified plaque in mid LCX causes 0-24% stenosis   RCA: CTFFR 0.86 across lesion in mid RCA and 0.83 across lesion in distal RCA, suggesting no functional significance  Patient Profile     55 y.o. female with no past cardiac history other than a negative stress test in 2016.  We are called because of T wave inversion on her EKG.  She has had chest pain with exertion recently and chronically and she has multiple risk factors.    Assessment & Plan    CHEST PAIN:  Abnormal EKG as noted in consult.  CT with plaque but not obstructive CAD.  No further testing.  She needs aggressive risk reduction and we talked about this.  See below.    TOBACCO:  Educated.      HTN:  Home meds resumed.  BP is up slightly.  She is instructed to get a BP cuff ([employer] brand suggested) and check BP two x daily for a couple of weeks and send results to Treasa School, PA-C for further management.  RISK REDUCTION:  I would suggest sending home with Lipitor 20 mg po daily with plans for lipid and liver by Treasa School, PA-C in one month.   I would send only 30 days and then let the PCP be the prescribing doctor after he follows up the labs.   No need for cardiology follow up.  Please call with further questions.     For questions or updates, please contact [facility] Please consult [email or web address] for contact info under Cardiology/STEMI.   Signed, Rollene Rotunda, MD  10/18/2021, 8:44 AM

## 2021-10-18 NOTE — Evaluation (Signed)
Occupational Therapy Evaluation Patient Details Name: Lori Ayers MRN: [id number] DOB: Nov 08, 1966 Today's Date: 10/18/2021   History of Present Illness Pt is a 55 y/o female presenting to ED on 11/7 after fall at home resulting in L patella fracture. S/p ORIF L patella on 11/9. PMH includes R THA (2016), MI, COPD, arthritis, and HTN.   Clinical Impression   Pt independent with ADLs and functional mobility at baseline. Pt min guard for bed mobility and transfers during session, pt moves impulsively and is agitated during session due to lack of communication regarding her d/c information. Pt reports 8/10 pain in LLE during session, and moves quickly and requires v/cing for hand placement and safety. Pt set up A- Min guard A for most ADLs, pt Mod A for simulated LE dressing, states her spouse will be able to assist. Educated pt on precautions and shower transfer technique using BSC pt already has at home, pt verbalizes understanding. Pt limited by decreased balance, activity tolerance, pain, and safety awareness at this time, will continue to follow acutely to address impairments listed.    Recommendations for follow up therapy are one component of a multi-disciplinary discharge planning process, led by the attending physician.  Recommendations may be updated based on patient status, additional functional criteria and insurance authorization.   Follow Up Recommendations  Follow physician's recommendations for discharge plan and follow up therapies    Assistance Recommended at Discharge Intermittent Supervision/Assistance  Functional Status Assessment  Patient has had a recent decline in their functional status and demonstrates the ability to make significant improvements in function in a reasonable and predictable amount of time.  Equipment Recommendations  None recommended by OT;Other (comment) (pt already has BSC)    Recommendations for Other Services PT consult     Precautions /  Restrictions Precautions Precautions: Fall Required Braces or Orthoses: Knee Immobilizer - Left Knee Immobilizer - Left: On at all times;Other (comment) (bledsoe brace knee extension) Restrictions Weight Bearing Restrictions: Yes      Mobility Bed Mobility Overal bed mobility: Needs Assistance Bed Mobility: Sit to Supine;Supine to Sit     Supine to sit: Min guard;HOB elevated Sit to supine: Min guard;HOB elevated   General bed mobility comments: pt able to move LLE to EOB without physical assistance    Transfers Overall transfer level: Needs assistance Equipment used: Rolling walker (2 wheels) Transfers: Sit to/from Stand Sit to Stand: Min guard                  Balance Overall balance assessment: Mild deficits observed, not formally tested Sitting-balance support: No upper extremity supported;Feet supported Sitting balance-Leahy Scale: Good     Standing balance support: Bilateral upper extremity supported;During functional activity Standing balance-Leahy Scale: Poor                             ADL either performed or assessed with clinical judgement   ADL Overall ADL's : Needs assistance/impaired Eating/Feeding: Set up;Sitting   Grooming: Set up;Sitting   Upper Body Bathing: Set up;Sitting   Lower Body Bathing: Moderate assistance;Sitting/lateral leans   Upper Body Dressing : Set up;Sitting   Lower Body Dressing: Moderate assistance;Sitting/lateral leans Lower Body Dressing Details (indicate cue type and reason): able to simulate LE dressing of R leg using figure 4 strategy in long sitting, anticipate assistance for L leg, pt notes spouse will be assisting at home Toilet Transfer: Min guard;Regular Toilet;Rolling walker (2 wheels) Statistician  Details (indicate cue type and reason): pt impulsively getting OOB despite v/cing for hand placement on bed/RW Toileting- Clothing Manipulation and Hygiene: Supervision/safety;Sit to/from stand        Functional mobility during ADLs: Min guard General ADL Comments: provided education on shower transfer using BSC pt has at home, demonstrated compensatory strategy to keep LLE out of shower until cleared. Pt verbalizes understanding.     Vision   Vision Assessment?: No apparent visual deficits     Perception     Praxis      Pertinent Vitals/Pain Pain Assessment: 0-10 Pain Score: 8  Pain Location: L leg Pain Descriptors / Indicators: Sore;Discomfort;Constant Pain Intervention(s): Limited activity within patient's tolerance;Monitored during session;Premedicated before session     Hand Dominance Right   Extremity/Trunk Assessment Upper Extremity Assessment Upper Extremity Assessment: Overall WFL for tasks assessed   Lower Extremity Assessment Lower Extremity Assessment: Defer to PT evaluation   Cervical / Trunk Assessment Cervical / Trunk Assessment: Normal   Communication Communication Communication: No difficulties   Cognition Arousal/Alertness: Awake/alert Behavior During Therapy: Agitated Overall Cognitive Status: Within Functional Limits for tasks assessed                                 General Comments: pt agitated due to pain and inability to recieve d/c information     General Comments  VSS throughout session    Exercises     Shoulder Instructions      Home Living Family/patient expects to be discharged to:: Private residence Living Arrangements: Spouse/significant other Available Help at Discharge: Family Type of Home: House Home Access: Stairs to enter Secretary/administrator of Steps: 2 Entrance Stairs-Rails: None Home Layout: One level     Bathroom Shower/Tub: Chief Strategy Officer: Standard     Home Equipment: Agricultural consultant (2 wheels);Crutches;BSC/3in1          Prior Functioning/Environment Prior Level of Function : Independent/Modified Independent                        OT Problem List:  Decreased strength;Decreased range of motion;Decreased activity tolerance;Pain;Decreased knowledge of use of DME or AE      OT Treatment/Interventions: Self-care/ADL training;Therapeutic activities;Balance training;Patient/family education    OT Goals(Current goals can be found in the care plan section) Acute Rehab OT Goals Patient Stated Goal: "return home" OT Goal Formulation: With patient Time For Goal Achievement: 11/01/21 Potential to Achieve Goals: Good  OT Frequency: Min 2X/week   Barriers to D/C: Other (comment);Decreased caregiver support (states spouse is still recovering from knee replacement he had 3 months ago)          Co-evaluation              AM-PAC OT "6 Clicks" Daily Activity     Outcome Measure Help from another person eating meals?: None Help from another person taking care of personal grooming?: None Help from another person toileting, which includes using toliet, bedpan, or urinal?: A Little Help from another person bathing (including washing, rinsing, drying)?: A Little Help from another person to put on and taking off regular upper body clothing?: A Little Help from another person to put on and taking off regular lower body clothing?: A Lot 6 Click Score: 19   End of Session Equipment Utilized During Treatment: Rolling walker (2 wheels);Gait belt Nurse Communication: Mobility status  Activity Tolerance: Patient limited by pain;Treatment  limited secondary to agitation Patient left: in bed;with call bell/phone within reach;with bed alarm set  OT Visit Diagnosis: Unsteadiness on feet (R26.81);Other abnormalities of gait and mobility (R26.89);Repeated falls (R29.6);Muscle weakness (generalized) (M62.81);Pain                Time: 0947-0962 OT Time Calculation (min): 16 min Charges:  OT General Charges $OT Visit: 1 Visit OT Evaluation $OT Eval Low Complexity: 1 Low  Alfonzo Beers, OTD, OTR/L Acute Rehab [phone]   Mayer Masker 10/18/2021, 9:28 AM

## 2021-11-27 ENCOUNTER — Ambulatory Visit (HOSPITAL_COMMUNITY): Admission: RE | Admit: 2021-11-27 | Payer: Medicaid Other | Source: Ambulatory Visit

## 2021-11-27 ENCOUNTER — Other Ambulatory Visit (HOSPITAL_COMMUNITY): Payer: Self-pay | Admitting: Orthopedic Surgery

## 2021-11-27 DIAGNOSIS — M7989 Other specified soft tissue disorders: Secondary | ICD-10-CM

## 2021-11-29 ENCOUNTER — Ambulatory Visit (HOSPITAL_COMMUNITY)
Admission: RE | Admit: 2021-11-29 | Discharge: 2021-11-29 | Disposition: A | Discharge status: Home / Self Care | Payer: Medicaid Other | Source: Ambulatory Visit | Attending: Cardiovascular Disease | Admitting: Cardiovascular Disease

## 2021-11-29 ENCOUNTER — Other Ambulatory Visit: Payer: Self-pay

## 2021-11-29 DIAGNOSIS — M79662 Pain in left lower leg: Secondary | ICD-10-CM

## 2021-11-29 DIAGNOSIS — M7989 Other specified soft tissue disorders: Secondary | ICD-10-CM | POA: Diagnosis not present

## 2022-09-03 IMAGING — CT CT HEART MORP W/ CTA COR W/ SCORE W/ CA W/CM &/OR W/O CM
1 of 2 series · 8 of 20 positions shown, 10 images · non-contrast
Comparison: CT abdomen 08/16/2018
COMPARISON: CT abdomen 08/16/2018

Addendum:
EXAM:
OVER-READ INTERPRETATION  CT CHEST

The following report is an over-read performed by radiologist Dr.
Wa Ssim Ura [REDACTED] on 10/17/2021. This over-read
does not include interpretation of cardiac or coronary anatomy or
pathology. The coronary calcium score/coronary CTA interpretation by
the cardiologist is attached.
CLINICAL DATA: 55F with chest pain
Cardiac/Coronary CTA
TECHNIQUE: The patient was scanned on a [employer] scanner.

[Series 291: coronaries · 8 of 14 slices shown, 10 images]
[im 2/14  vessel]
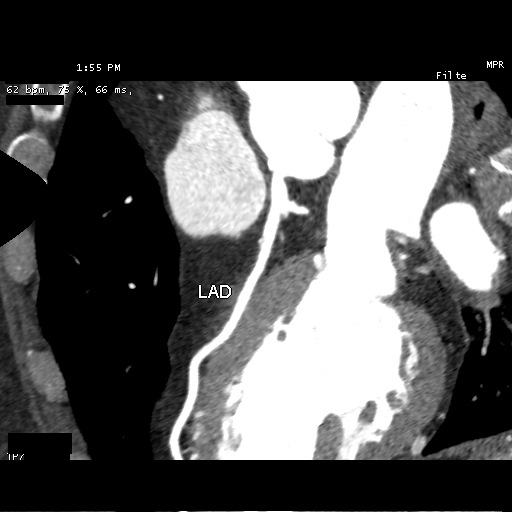
[im 2/14  lung]
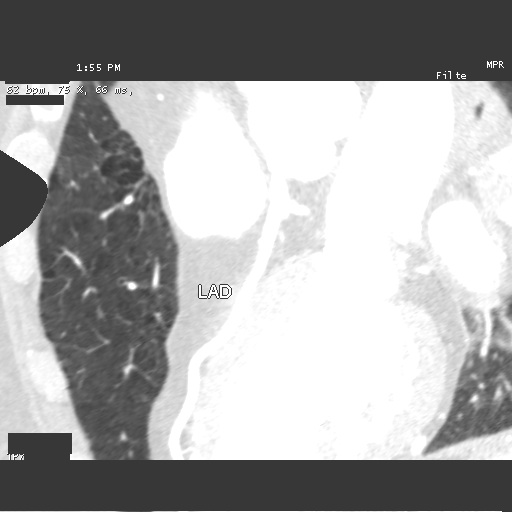
[im 3/14  vessel]
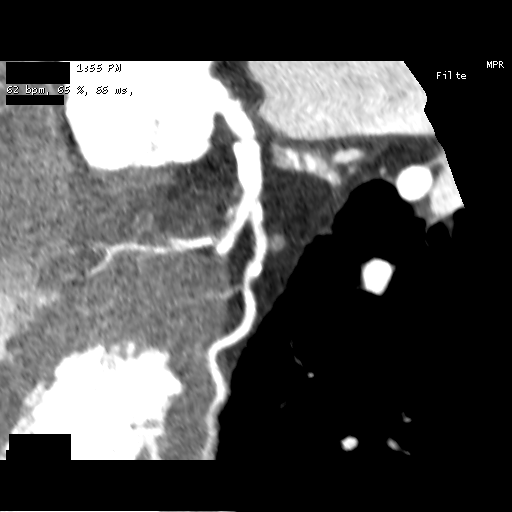
[im 5/14  vessel]
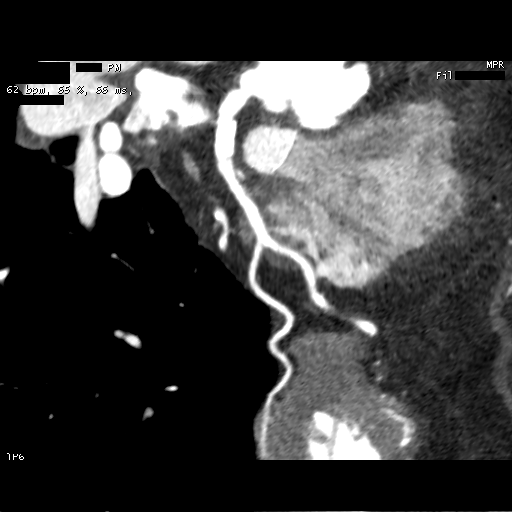
[im 6/14  vessel]
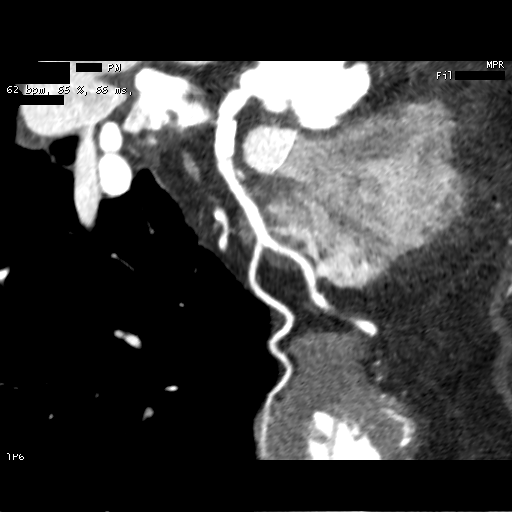
[im 8/14  vessel]
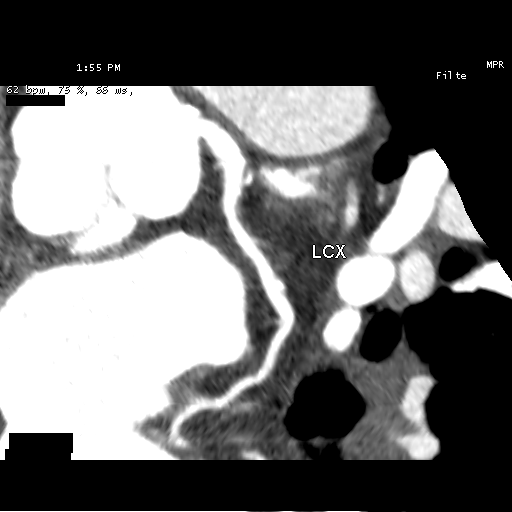
[im 8/14  lung]
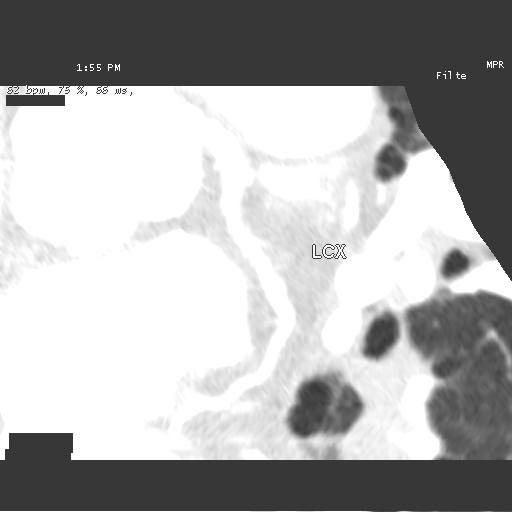
[im 9/14  vessel]
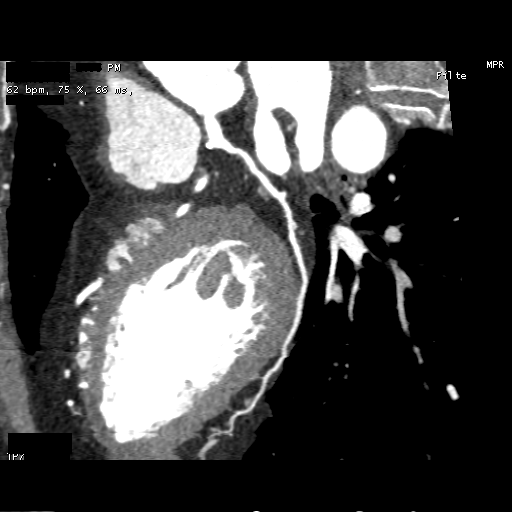
[im 11/14  vessel]
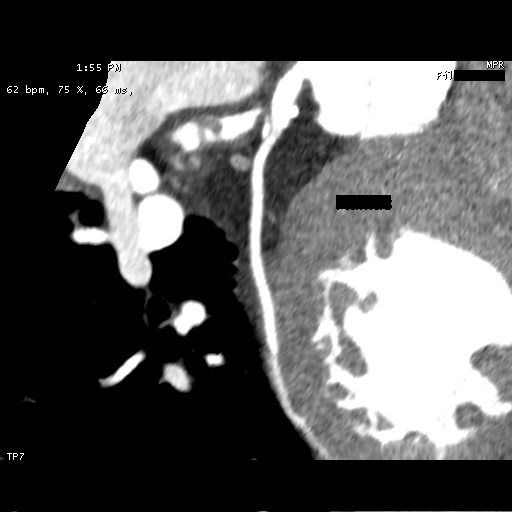
[im 12/14  vessel]
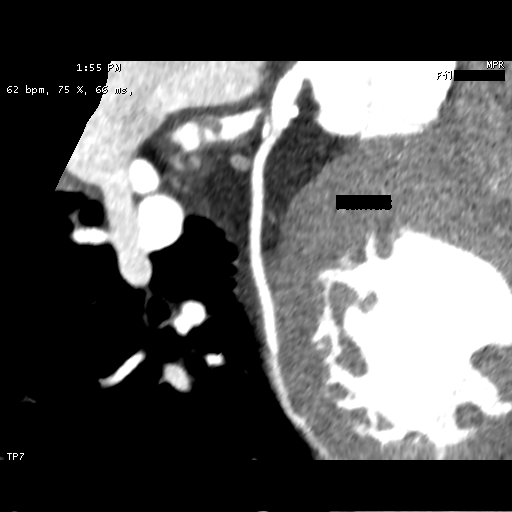

[8 of 20 positions shown; findings below may reference images not displayed]

FINDINGS: Vascular: Atherosclerotic disease involving the descending thoracic
aorta without evidence for a dissection. Visualized pulmonary
arteries are patent.

Mediastinum/Nodes: Visualized mediastinal structures are normal.

Lzungs/Pleura: Centrilobular emphysema. Patchy densities in the
dependent lower lobes, right side greater than left. No pleural
effusions.

Upper Abdomen: Decreased attenuation in the liver is compatible with
steatosis.

Musculoskeletal: Multiple left rib fractures with callus formation.
IMPRESSION: 1. Dependent densities in lower lungs, right side greater than left.
Favor atelectasis. No pleural effusions.
2. Hepatic steatosis.
3. Aortic Atherosclerosis (HO144-JOS.S) and Emphysema (HO144-ORQ.8).
FINDINGS: A 100 kV prospective scan was triggered in the descending thoracic
aorta at 111 HU's. Axial non-contrast 3 mm slices were carried out
through the heart. The data set was analyzed on a dedicated work
station and scored using the Agatson method. Gantry rotation speed
was 250 msecs and collimation was .6 mm. 0.8 mg of sl NTG was given.
The 3D data set was reconstructed in 5% intervals of the 35-75% of
the R-R cycle. Phases were analyzed on a dedicated work station
using MPR, MIP and VRT modes. The patient received 80 cc of
contrast.

Coronary Arteries:  Normal coronary origin.  Right dominance.

RCA is a large dominant artery that gives rise to PDA and PLA. Mixed
plaque in proximal to mid RCA causes ~50% stenosis. Mixed plaque
in distal RCA causes 25-49% stenosis

Left main is a large artery that gives rise to LAD and LCX arteries.
Mixed plaque in ostial left main causes 25-49% stenosis.

LAD is a large vessel that has no plaque. Calcified plaque in
proximal LAD causes 0 -24% stenosis. Mixed plaque in D1 causes
25-49% stenosis

LCX is a non-dominant artery. Noncalcified plaque in proximal LCX
causes 25-49% stenosis. Calcified plaque in mid LCX causes 0-24%
stenosis

Mixed plaque in proximal ramus causes 0-24% stenosis

Other findings:

Left Ventricle: Normal size

Left Atrium: Normal size

Pulmonary Veins: Normal configuration

Right Ventricle: Normal size

Right Atrium: Normal size

Cardiac valves: No calcifications

Thoracic aorta: Normal size

Pulmonary Arteries: Normal size

Systemic Veins: Normal drainage

Pericardium: Normal thickness
IMPRESSION: 1. Coronary calcium score of 386. This was 99th percentile for age
and sex matched control.

2. Normal coronary origin with right dominance.

3. Mixed plaque in proximal to mid RCA causes mild to moderate
(~50%) stenosis. Mixed plaque in distal RCA causes mild (25-49%)
stenosis

4. Mixed plaque in ostial left main causes mild (25-49%) stenosis.

5. Calcified plaque in proximal LAD causes minimal (0-24%) stenosis.
Mixed plaque in D1 causes mild (25-49%) stenosis

6. Noncalcified plaque in proximal LCX causes mild (25-49%)
stenosis. Calcified plaque in mid LCX causes minimal (0-24%)
stenosis

7. Mixed plaque in proximal ramus causes minimal (0-24%) stenosis

8. Will send for CT FFR

CAD-RADS 3. Moderate stenosis. Consider symptom-guided anti-ischemic
pharmacotherapy as well as risk factor modification per guideline
directed care. Additional analysis with CT FFR will be submitted.

*** End of Addendum ***
EXAM:
OVER-READ INTERPRETATION  CT CHEST

The following report is an over-read performed by radiologist Dr.
Wa Ssim Ura [REDACTED] on 10/17/2021. This over-read
does not include interpretation of cardiac or coronary anatomy or
pathology. The coronary calcium score/coronary CTA interpretation by
the cardiologist is attached.
FINDINGS: Vascular: Atherosclerotic disease involving the descending thoracic
aorta without evidence for a dissection. Visualized pulmonary
arteries are patent.

Mediastinum/Nodes: Visualized mediastinal structures are normal.

Lzungs/Pleura: Centrilobular emphysema. Patchy densities in the
dependent lower lobes, right side greater than left. No pleural
effusions.

Upper Abdomen: Decreased attenuation in the liver is compatible with
steatosis.

Musculoskeletal: Multiple left rib fractures with callus formation.
IMPRESSION: 1. Dependent densities in lower lungs, right side greater than left.
Favor atelectasis. No pleural effusions.
2. Hepatic steatosis.
3. Aortic Atherosclerosis (HO144-JOS.S) and Emphysema (HO144-ORQ.8).

## 2022-11-18 ENCOUNTER — Ambulatory Visit: Payer: Medicaid Other | Admitting: Adult Health

## 2022-11-18 NOTE — Progress Notes (Deleted)
Patient presents to clinic today to establish care. He is a pleasant 56 year old female who  has a past medical history of Arthritis, Chronic back pain, COPD (chronic obstructive pulmonary disease) (HCC), CPAP (continuous positive airway pressure) dependence, GERD (gastroesophageal reflux disease), Hypertension, Scoliosis, and Sleep apnea.   Acute Concerns: Establish Care   Chronic Issues:   Health Maintenance: Dental -- Vision -- Immunizations -- Colonoscopy -- Mammogram -- PAP --  Bone Density --    Past Medical History:  Diagnosis Date   Arthritis    Chronic back pain    COPD (chronic obstructive pulmonary disease) (HCC)    CPAP (continuous positive airway pressure) dependence    GERD (gastroesophageal reflux disease)    Hypertension    Scoliosis    Sleep apnea     Past Surgical History:  Procedure Laterality Date   APPENDECTOMY     ORIF PATELLA Left 10/16/2021   Procedure: OPEN REDUCTION INTERNAL (ORIF) FIXATION PATELLA;  Surgeon: Nicholes Stairs, MD;  Location: [facility];  Service: Orthopedics;  Laterality: Left;   TONSILLECTOMY     TOTAL HIP ARTHROPLASTY Right 2016    Current Outpatient Medications on File Prior to Visit  Medication Sig Dispense Refill   albuterol (VENTOLIN HFA) 108 (90 Base) MCG/ACT inhaler Inhale 2 puffs into the lungs every 6 (six) hours as needed for wheezing or shortness of breath.     amitriptyline (ELAVIL) 50 MG tablet Take 50-100 mg by mouth at bedtime.     busPIRone (BUSPAR) 15 MG tablet Take 15 mg by mouth 3 (three) times daily.     fluticasone furoate-vilanterol (BREO ELLIPTA) 100-25 MCG/ACT AEPB Inhale 1 puff into the lungs daily as needed (asthma).     lisinopril (ZESTRIL) 20 MG tablet Take 20 mg by mouth daily.     methocarbamol (ROBAXIN) 500 MG tablet Take 1 tablet (500 mg total) by mouth every 6 (six) hours as needed for muscle spasms. 50 tablet 1   metoprolol succinate (TOPROL-XL) 25 MG 24 hr tablet Take 25 mg by mouth  daily.     omeprazole (PRILOSEC) 40 MG capsule Take 40 mg by mouth daily.     Oxycodone HCl 20 MG TABS Take 1 tablet by mouth 5 (five) times daily as needed.     Vitamin D, Ergocalciferol, (DRISDOL) 1.25 MG (50000 UNIT) CAPS capsule Take 50,000 Units by mouth every Thursday.     No current facility-administered medications on file prior to visit.    Allergies  Allergen Reactions   Gabapentin Other (See Comments)    Leg and arm jerking at bedtime     Family History  Problem Relation Age of Onset   Cancer Mother    Hypertension Mother     Social History   Socioeconomic History   Marital status: Married    Spouse name: Not on file   Number of children: Not on file   Years of education: Not on file   Highest education level: Not on file  Occupational History   Not on file  Tobacco Use   Smoking status: Every Day    Packs/day: 1.00    Years: 40.00    Total pack years: 40.00    Types: Cigarettes   Smokeless tobacco: Never  Vaping Use   Vaping Use: Never used  Substance and Sexual Activity   Alcohol use: No   Drug use: No   Sexual activity: Not on file  Other Topics Concern   Not on file  Social History Narrative   Not on file   Social Determinants of Health   Financial Resource Strain: Not on file  Food Insecurity: Not on file  Transportation Needs: Not on file  Physical Activity: Not on file  Stress: Not on file  Social Connections: Not on file  Intimate Partner Violence: Not on file    ROS  LMP 12/29/2012   Physical Exam  No results found for this or any previous visit (from the past 2160 hour(s)).  Assessment/Plan: No problem-specific Assessment & Plan notes found for this encounter.

## 2023-02-16 ENCOUNTER — Encounter (HOSPITAL_BASED_OUTPATIENT_CLINIC_OR_DEPARTMENT_OTHER): Payer: Medicaid Other | Attending: General Surgery | Admitting: General Surgery
